# Patient Record
Sex: Female | Born: 1960 | Race: White | Hispanic: No | Smoking: Current every day smoker
Health system: Southern US, Community
[De-identification: ages and names within clinical notes are randomized; demographics above are authoritative.]

## PROBLEM LIST (undated history)

## (undated) DIAGNOSIS — E785 Hyperlipidemia, unspecified: Secondary | ICD-10-CM

## (undated) DIAGNOSIS — I2510 Atherosclerotic heart disease of native coronary artery without angina pectoris: Secondary | ICD-10-CM

## (undated) DIAGNOSIS — Z9861 Coronary angioplasty status: Secondary | ICD-10-CM

## (undated) DIAGNOSIS — F419 Anxiety disorder, unspecified: Secondary | ICD-10-CM

## (undated) DIAGNOSIS — I10 Essential (primary) hypertension: Secondary | ICD-10-CM

## (undated) DIAGNOSIS — C50919 Malignant neoplasm of unspecified site of unspecified female breast: Secondary | ICD-10-CM

## (undated) DIAGNOSIS — F17200 Nicotine dependence, unspecified, uncomplicated: Secondary | ICD-10-CM

## (undated) HISTORY — DX: Atherosclerotic heart disease of native coronary artery without angina pectoris: I25.10

## (undated) HISTORY — DX: Essential (primary) hypertension: I10

## (undated) HISTORY — DX: Hyperlipidemia, unspecified: E78.5

## (undated) HISTORY — DX: Coronary angioplasty status: Z98.61

## (undated) HISTORY — DX: Anxiety disorder, unspecified: F41.9

## (undated) HISTORY — PX: CORONARY ANGIOPLASTY WITH STENT PLACEMENT: SHX49

## (undated) HISTORY — DX: Malignant neoplasm of unspecified site of unspecified female breast: C50.919

## (undated) HISTORY — DX: Nicotine dependence, unspecified, uncomplicated: F17.200

---

## 1999-02-03 ENCOUNTER — Other Ambulatory Visit: Admission: RE | Admit: 1999-02-03 | Discharge: 1999-02-03 | Payer: Self-pay | Admitting: *Deleted

## 2001-10-16 ENCOUNTER — Other Ambulatory Visit: Admission: RE | Admit: 2001-10-16 | Discharge: 2001-10-16 | Payer: Self-pay | Admitting: *Deleted

## 2002-12-20 DIAGNOSIS — C50919 Malignant neoplasm of unspecified site of unspecified female breast: Secondary | ICD-10-CM

## 2002-12-20 HISTORY — DX: Malignant neoplasm of unspecified site of unspecified female breast: C50.919

## 2002-12-20 HISTORY — PX: BREAST LUMPECTOMY: SHX2

## 2002-12-27 ENCOUNTER — Encounter (INDEPENDENT_AMBULATORY_CARE_PROVIDER_SITE_OTHER): Payer: Self-pay | Admitting: Specialist

## 2002-12-27 ENCOUNTER — Encounter: Payer: Self-pay | Admitting: Family Medicine

## 2002-12-27 ENCOUNTER — Encounter: Admission: RE | Admit: 2002-12-27 | Discharge: 2002-12-27 | Payer: Self-pay | Admitting: Nurse Practitioner

## 2002-12-27 ENCOUNTER — Other Ambulatory Visit: Admission: RE | Admit: 2002-12-27 | Discharge: 2002-12-27 | Payer: Self-pay | Admitting: Diagnostic Radiology

## 2002-12-31 ENCOUNTER — Encounter: Admission: RE | Admit: 2002-12-31 | Discharge: 2002-12-31 | Payer: Self-pay | Admitting: General Surgery

## 2002-12-31 ENCOUNTER — Encounter: Payer: Self-pay | Admitting: General Surgery

## 2003-01-01 ENCOUNTER — Encounter: Payer: Self-pay | Admitting: General Surgery

## 2003-01-01 ENCOUNTER — Ambulatory Visit (HOSPITAL_BASED_OUTPATIENT_CLINIC_OR_DEPARTMENT_OTHER): Admission: RE | Admit: 2003-01-01 | Discharge: 2003-01-01 | Payer: Self-pay | Admitting: General Surgery

## 2003-01-01 ENCOUNTER — Encounter (INDEPENDENT_AMBULATORY_CARE_PROVIDER_SITE_OTHER): Payer: Self-pay | Admitting: *Deleted

## 2003-01-10 ENCOUNTER — Ambulatory Visit: Admission: RE | Admit: 2003-01-10 | Discharge: 2003-02-05 | Payer: Self-pay | Admitting: Radiation Oncology

## 2003-01-14 ENCOUNTER — Encounter: Payer: Self-pay | Admitting: Oncology

## 2003-01-14 ENCOUNTER — Ambulatory Visit (HOSPITAL_COMMUNITY): Admission: RE | Admit: 2003-01-14 | Discharge: 2003-01-14 | Payer: Self-pay | Admitting: Oncology

## 2003-01-17 ENCOUNTER — Ambulatory Visit: Admission: RE | Admit: 2003-01-17 | Discharge: 2003-01-17 | Payer: Self-pay | Admitting: Oncology

## 2003-01-17 ENCOUNTER — Encounter: Payer: Self-pay | Admitting: Cardiology

## 2003-01-28 ENCOUNTER — Encounter: Payer: Self-pay | Admitting: General Surgery

## 2003-01-28 ENCOUNTER — Ambulatory Visit (HOSPITAL_BASED_OUTPATIENT_CLINIC_OR_DEPARTMENT_OTHER): Admission: RE | Admit: 2003-01-28 | Discharge: 2003-01-28 | Payer: Self-pay | Admitting: General Surgery

## 2003-04-17 ENCOUNTER — Other Ambulatory Visit: Admission: RE | Admit: 2003-04-17 | Discharge: 2003-04-17 | Payer: Self-pay | Admitting: Oncology

## 2003-07-25 ENCOUNTER — Ambulatory Visit: Admission: RE | Admit: 2003-07-25 | Discharge: 2003-10-04 | Payer: Self-pay | Admitting: Radiation Oncology

## 2003-08-01 ENCOUNTER — Encounter: Admission: RE | Admit: 2003-08-01 | Discharge: 2003-08-01 | Payer: Self-pay | Admitting: Radiation Oncology

## 2003-08-27 ENCOUNTER — Encounter: Admission: EM | Admit: 2003-08-27 | Discharge: 2003-08-27 | Payer: Self-pay | Admitting: Dentistry

## 2003-09-04 ENCOUNTER — Ambulatory Visit (HOSPITAL_COMMUNITY): Admission: RE | Admit: 2003-09-04 | Discharge: 2003-09-04 | Payer: Self-pay | Admitting: Dentistry

## 2003-09-16 ENCOUNTER — Ambulatory Visit (HOSPITAL_BASED_OUTPATIENT_CLINIC_OR_DEPARTMENT_OTHER): Admission: RE | Admit: 2003-09-16 | Discharge: 2003-09-16 | Payer: Self-pay | Admitting: General Surgery

## 2003-11-07 ENCOUNTER — Ambulatory Visit: Admission: RE | Admit: 2003-11-07 | Discharge: 2003-11-07 | Payer: Self-pay | Admitting: Radiation Oncology

## 2003-12-16 ENCOUNTER — Encounter: Admission: RE | Admit: 2003-12-16 | Discharge: 2003-12-16 | Payer: Self-pay | Admitting: Family Medicine

## 2004-01-16 ENCOUNTER — Encounter: Admission: RE | Admit: 2004-01-16 | Discharge: 2004-01-16 | Payer: Self-pay | Admitting: General Surgery

## 2004-01-17 ENCOUNTER — Encounter (INDEPENDENT_AMBULATORY_CARE_PROVIDER_SITE_OTHER): Payer: Self-pay | Admitting: *Deleted

## 2004-01-17 ENCOUNTER — Encounter: Admission: RE | Admit: 2004-01-17 | Discharge: 2004-01-17 | Payer: Self-pay | Admitting: General Surgery

## 2004-05-07 ENCOUNTER — Ambulatory Visit: Admission: RE | Admit: 2004-05-07 | Discharge: 2004-05-07 | Payer: Self-pay | Admitting: Radiation Oncology

## 2004-07-31 ENCOUNTER — Encounter: Admission: RE | Admit: 2004-07-31 | Discharge: 2004-07-31 | Payer: Self-pay | Admitting: Oncology

## 2004-08-30 ENCOUNTER — Emergency Department (HOSPITAL_COMMUNITY): Admission: EM | Admit: 2004-08-30 | Discharge: 2004-08-31 | Payer: Self-pay | Admitting: Emergency Medicine

## 2004-09-02 ENCOUNTER — Encounter (INDEPENDENT_AMBULATORY_CARE_PROVIDER_SITE_OTHER): Payer: Self-pay | Admitting: Cardiology

## 2004-09-02 ENCOUNTER — Inpatient Hospital Stay (HOSPITAL_COMMUNITY): Admission: EM | Admit: 2004-09-02 | Discharge: 2004-09-07 | Payer: Self-pay | Admitting: Emergency Medicine

## 2004-09-14 ENCOUNTER — Ambulatory Visit: Payer: Self-pay | Admitting: Dentistry

## 2004-11-06 ENCOUNTER — Ambulatory Visit (HOSPITAL_COMMUNITY): Admission: RE | Admit: 2004-11-06 | Discharge: 2004-11-06 | Payer: Self-pay | Admitting: Cardiology

## 2004-11-22 ENCOUNTER — Ambulatory Visit: Payer: Self-pay | Admitting: Oncology

## 2004-12-04 ENCOUNTER — Ambulatory Visit: Payer: Self-pay | Admitting: Dentistry

## 2005-01-15 ENCOUNTER — Ambulatory Visit: Payer: Self-pay | Admitting: Oncology

## 2005-04-29 ENCOUNTER — Ambulatory Visit: Payer: Self-pay | Admitting: Oncology

## 2005-07-19 ENCOUNTER — Ambulatory Visit: Payer: Self-pay | Admitting: Oncology

## 2005-07-26 ENCOUNTER — Ambulatory Visit: Payer: Self-pay | Admitting: Dentistry

## 2005-08-02 ENCOUNTER — Encounter: Admission: RE | Admit: 2005-08-02 | Discharge: 2005-08-02 | Payer: Self-pay | Admitting: Oncology

## 2005-08-29 ENCOUNTER — Emergency Department (HOSPITAL_COMMUNITY): Admission: EM | Admit: 2005-08-29 | Discharge: 2005-08-29 | Payer: Self-pay | Admitting: Emergency Medicine

## 2006-01-24 ENCOUNTER — Encounter: Admission: RE | Admit: 2006-01-24 | Discharge: 2006-01-24 | Payer: Self-pay | Admitting: Orthopedic Surgery

## 2006-01-24 ENCOUNTER — Ambulatory Visit: Payer: Self-pay | Admitting: Oncology

## 2006-02-04 ENCOUNTER — Ambulatory Visit: Payer: Self-pay | Admitting: Dentistry

## 2006-03-14 ENCOUNTER — Ambulatory Visit: Payer: Self-pay | Admitting: Dentistry

## 2006-03-25 ENCOUNTER — Encounter: Admission: RE | Admit: 2006-03-25 | Discharge: 2006-03-25 | Payer: Self-pay | Admitting: Gastroenterology

## 2006-07-11 ENCOUNTER — Encounter: Admission: RE | Admit: 2006-07-11 | Discharge: 2006-07-11 | Payer: Self-pay | Admitting: Orthopedic Surgery

## 2006-09-05 ENCOUNTER — Encounter: Admission: RE | Admit: 2006-09-05 | Discharge: 2006-09-05 | Payer: Self-pay | Admitting: Oncology

## 2006-09-30 ENCOUNTER — Ambulatory Visit: Payer: Self-pay | Admitting: Oncology

## 2006-10-07 ENCOUNTER — Ambulatory Visit: Payer: Self-pay | Admitting: Dentistry

## 2006-11-07 ENCOUNTER — Encounter: Admission: RE | Admit: 2006-11-07 | Discharge: 2006-11-07 | Payer: Self-pay | Admitting: Orthopedic Surgery

## 2006-11-18 ENCOUNTER — Ambulatory Visit: Payer: Self-pay | Admitting: Oncology

## 2006-11-22 LAB — COMPREHENSIVE METABOLIC PANEL
Albumin: 4.9 g/dL (ref 3.5–5.2)
BUN: 11 mg/dL (ref 6–23)
CO2: 25 mEq/L (ref 19–32)
Glucose, Bld: 101 mg/dL — ABNORMAL HIGH (ref 70–99)
Sodium: 142 mEq/L (ref 135–145)
Total Bilirubin: 0.6 mg/dL (ref 0.3–1.2)
Total Protein: 7.2 g/dL (ref 6.0–8.3)

## 2006-11-22 LAB — CBC WITH DIFFERENTIAL/PLATELET
Basophils Absolute: 0 10*3/uL (ref 0.0–0.1)
Eosinophils Absolute: 0.1 10*3/uL (ref 0.0–0.5)
HCT: 42 % (ref 34.8–46.6)
HGB: 14.6 g/dL (ref 11.6–15.9)
LYMPH%: 26.5 % (ref 14.0–48.0)
MCV: 97.5 fL (ref 81.0–101.0)
MONO#: 0.4 10*3/uL (ref 0.1–0.9)
NEUT#: 4.6 10*3/uL (ref 1.5–6.5)
Platelets: 230 10*3/uL (ref 145–400)
RBC: 4.31 10*6/uL (ref 3.70–5.32)
WBC: 7 10*3/uL (ref 3.9–10.0)

## 2006-11-22 LAB — CANCER ANTIGEN 27.29: CA 27.29: 8 U/mL (ref 0–39)

## 2006-12-22 ENCOUNTER — Encounter: Admission: RE | Admit: 2006-12-22 | Discharge: 2006-12-22 | Payer: Self-pay | Admitting: Orthopedic Surgery

## 2007-05-19 ENCOUNTER — Ambulatory Visit: Payer: Self-pay | Admitting: Dentistry

## 2007-06-05 ENCOUNTER — Ambulatory Visit: Payer: Self-pay | Admitting: Oncology

## 2007-08-18 ENCOUNTER — Ambulatory Visit: Payer: Self-pay | Admitting: Oncology

## 2007-08-23 ENCOUNTER — Inpatient Hospital Stay (HOSPITAL_COMMUNITY): Admission: EM | Admit: 2007-08-23 | Discharge: 2007-08-29 | Payer: Self-pay | Admitting: Emergency Medicine

## 2007-08-23 ENCOUNTER — Encounter: Payer: Self-pay | Admitting: Emergency Medicine

## 2007-08-23 LAB — BASIC METABOLIC PANEL
CO2: 23 mEq/L (ref 19–32)
Calcium: 9.6 mg/dL (ref 8.4–10.5)
Potassium: 3.7 mEq/L (ref 3.5–5.3)
Sodium: 139 mEq/L (ref 135–145)

## 2007-09-07 ENCOUNTER — Encounter: Admission: RE | Admit: 2007-09-07 | Discharge: 2007-09-07 | Payer: Self-pay | Admitting: Oncology

## 2007-09-29 ENCOUNTER — Ambulatory Visit: Payer: Self-pay | Admitting: Oncology

## 2008-03-04 ENCOUNTER — Ambulatory Visit: Payer: Self-pay | Admitting: Oncology

## 2008-03-06 LAB — CBC WITH DIFFERENTIAL/PLATELET
BASO%: 0.3 % (ref 0.0–2.0)
EOS%: 5.1 % (ref 0.0–7.0)
HGB: 13.9 g/dL (ref 11.6–15.9)
MCH: 33.4 pg (ref 26.0–34.0)
MCHC: 35 g/dL (ref 32.0–36.0)
RDW: 12.3 % (ref 11.3–14.5)
lymph#: 2 10*3/uL (ref 0.9–3.3)

## 2008-03-07 LAB — COMPREHENSIVE METABOLIC PANEL
ALT: 11 U/L (ref 0–35)
AST: 16 U/L (ref 0–37)
Albumin: 4.9 g/dL (ref 3.5–5.2)
Calcium: 10 mg/dL (ref 8.4–10.5)
Chloride: 105 mEq/L (ref 96–112)
Potassium: 4.3 mEq/L (ref 3.5–5.3)
Sodium: 139 mEq/L (ref 135–145)

## 2008-03-19 ENCOUNTER — Encounter: Admission: RE | Admit: 2008-03-19 | Discharge: 2008-03-19 | Payer: Self-pay | Admitting: Orthopedic Surgery

## 2008-04-08 ENCOUNTER — Encounter: Admission: RE | Admit: 2008-04-08 | Discharge: 2008-04-08 | Payer: Self-pay | Admitting: Orthopedic Surgery

## 2008-04-19 ENCOUNTER — Encounter: Admission: RE | Admit: 2008-04-19 | Discharge: 2008-04-19 | Payer: Self-pay | Admitting: Orthopedic Surgery

## 2008-09-04 ENCOUNTER — Encounter: Admission: RE | Admit: 2008-09-04 | Discharge: 2008-09-04 | Payer: Self-pay | Admitting: Orthopedic Surgery

## 2008-09-09 ENCOUNTER — Encounter: Admission: RE | Admit: 2008-09-09 | Discharge: 2008-09-09 | Payer: Self-pay | Admitting: Oncology

## 2008-09-12 ENCOUNTER — Ambulatory Visit: Payer: Self-pay | Admitting: Oncology

## 2008-09-23 LAB — CBC WITH DIFFERENTIAL/PLATELET
Basophils Absolute: 0 10*3/uL (ref 0.0–0.1)
Eosinophils Absolute: 0.1 10*3/uL (ref 0.0–0.5)
HGB: 14.2 g/dL (ref 11.6–15.9)
LYMPH%: 39.9 % (ref 14.0–48.0)
MONO#: 0.3 10*3/uL (ref 0.1–0.9)
NEUT#: 2.7 10*3/uL (ref 1.5–6.5)
Platelets: 185 10*3/uL (ref 145–400)
RBC: 4.22 10*6/uL (ref 3.70–5.32)
WBC: 5.3 10*3/uL (ref 3.9–10.0)

## 2008-09-23 LAB — COMPREHENSIVE METABOLIC PANEL
ALT: 17 U/L (ref 0–35)
CO2: 26 mEq/L (ref 19–32)
Calcium: 9.4 mg/dL (ref 8.4–10.5)
Chloride: 104 mEq/L (ref 96–112)
Creatinine, Ser: 0.91 mg/dL (ref 0.40–1.20)
Total Protein: 6.7 g/dL (ref 6.0–8.3)

## 2008-09-23 LAB — CANCER ANTIGEN 27.29: CA 27.29: 12 U/mL (ref 0–39)

## 2008-09-27 ENCOUNTER — Encounter: Admission: RE | Admit: 2008-09-27 | Discharge: 2008-09-27 | Payer: Self-pay | Admitting: Oncology

## 2008-10-07 ENCOUNTER — Encounter: Admission: RE | Admit: 2008-10-07 | Discharge: 2008-10-07 | Payer: Self-pay | Admitting: Orthopedic Surgery

## 2008-10-18 ENCOUNTER — Inpatient Hospital Stay (HOSPITAL_COMMUNITY): Admission: EM | Admit: 2008-10-18 | Discharge: 2008-10-21 | Payer: Self-pay | Admitting: Emergency Medicine

## 2008-11-01 ENCOUNTER — Encounter: Admission: RE | Admit: 2008-11-01 | Discharge: 2008-11-01 | Payer: Self-pay | Admitting: Orthopedic Surgery

## 2009-03-11 ENCOUNTER — Encounter: Admission: RE | Admit: 2009-03-11 | Discharge: 2009-03-11 | Payer: Self-pay | Admitting: Orthopedic Surgery

## 2009-03-19 ENCOUNTER — Ambulatory Visit: Payer: Self-pay | Admitting: Oncology

## 2009-03-24 LAB — COMPREHENSIVE METABOLIC PANEL
Albumin: 4.2 g/dL (ref 3.5–5.2)
BUN: 17 mg/dL (ref 6–23)
CO2: 25 mEq/L (ref 19–32)
Calcium: 9.3 mg/dL (ref 8.4–10.5)
Chloride: 106 mEq/L (ref 96–112)
Glucose, Bld: 104 mg/dL — ABNORMAL HIGH (ref 70–99)
Potassium: 3.7 mEq/L (ref 3.5–5.3)
Sodium: 138 mEq/L (ref 135–145)
Total Protein: 6.6 g/dL (ref 6.0–8.3)

## 2009-03-24 LAB — CBC WITH DIFFERENTIAL/PLATELET
Basophils Absolute: 0 10*3/uL (ref 0.0–0.1)
Eosinophils Absolute: 0.1 10*3/uL (ref 0.0–0.5)
HGB: 14.4 g/dL (ref 11.6–15.9)
MCV: 92.4 fL (ref 79.5–101.0)
MONO#: 0.5 10*3/uL (ref 0.1–0.9)
MONO%: 7 % (ref 0.0–14.0)
NEUT#: 3.8 10*3/uL (ref 1.5–6.5)
RBC: 4.37 10*6/uL (ref 3.70–5.45)
RDW: 12.7 % (ref 11.2–14.5)
WBC: 6.7 10*3/uL (ref 3.9–10.3)
lymph#: 2.3 10*3/uL (ref 0.9–3.3)

## 2009-04-01 ENCOUNTER — Encounter: Admission: RE | Admit: 2009-04-01 | Discharge: 2009-04-01 | Payer: Self-pay | Admitting: Orthopedic Surgery

## 2009-05-06 ENCOUNTER — Encounter: Admission: RE | Admit: 2009-05-06 | Discharge: 2009-05-06 | Payer: Self-pay | Admitting: Orthopedic Surgery

## 2009-05-27 ENCOUNTER — Inpatient Hospital Stay (HOSPITAL_COMMUNITY): Admission: EM | Admit: 2009-05-27 | Discharge: 2009-05-29 | Payer: Self-pay | Admitting: Emergency Medicine

## 2009-08-23 ENCOUNTER — Emergency Department (HOSPITAL_COMMUNITY): Admission: EM | Admit: 2009-08-23 | Discharge: 2009-08-23 | Payer: Self-pay | Admitting: Emergency Medicine

## 2009-09-10 ENCOUNTER — Encounter: Admission: RE | Admit: 2009-09-10 | Discharge: 2009-09-10 | Payer: Self-pay | Admitting: Oncology

## 2009-10-16 ENCOUNTER — Ambulatory Visit: Payer: Self-pay | Admitting: Oncology

## 2009-11-10 LAB — CBC WITH DIFFERENTIAL/PLATELET
BASO%: 0.6 % (ref 0.0–2.0)
Eosinophils Absolute: 0.2 10*3/uL (ref 0.0–0.5)
HCT: 40.8 % (ref 34.8–46.6)
LYMPH%: 37.1 % (ref 14.0–49.7)
MCHC: 34.3 g/dL (ref 31.5–36.0)
MCV: 96.4 fL (ref 79.5–101.0)
MONO#: 0.3 10*3/uL (ref 0.1–0.9)
MONO%: 5.8 % (ref 0.0–14.0)
NEUT%: 52.6 % (ref 38.4–76.8)
Platelets: 185 10*3/uL (ref 145–400)
RBC: 4.24 10*6/uL (ref 3.70–5.45)

## 2009-11-10 LAB — COMPREHENSIVE METABOLIC PANEL
Alkaline Phosphatase: 57 U/L (ref 39–117)
CO2: 27 mEq/L (ref 19–32)
Creatinine, Ser: 0.93 mg/dL (ref 0.40–1.20)
Glucose, Bld: 123 mg/dL — ABNORMAL HIGH (ref 70–99)
Sodium: 137 mEq/L (ref 135–145)
Total Bilirubin: 0.8 mg/dL (ref 0.3–1.2)
Total Protein: 6.6 g/dL (ref 6.0–8.3)

## 2009-12-05 ENCOUNTER — Encounter: Admission: RE | Admit: 2009-12-05 | Discharge: 2009-12-05 | Payer: Self-pay | Admitting: Orthopedic Surgery

## 2010-02-24 ENCOUNTER — Encounter: Admission: RE | Admit: 2010-02-24 | Discharge: 2010-02-24 | Payer: Self-pay | Admitting: Orthopedic Surgery

## 2010-03-03 ENCOUNTER — Inpatient Hospital Stay (HOSPITAL_COMMUNITY): Admission: EM | Admit: 2010-03-03 | Discharge: 2010-03-06 | Payer: Self-pay | Admitting: Cardiology

## 2010-03-24 ENCOUNTER — Encounter (INDEPENDENT_AMBULATORY_CARE_PROVIDER_SITE_OTHER): Payer: Self-pay | Admitting: Cardiology

## 2010-10-09 ENCOUNTER — Encounter: Admission: RE | Admit: 2010-10-09 | Discharge: 2010-10-09 | Payer: Self-pay | Admitting: Family Medicine

## 2011-01-10 ENCOUNTER — Encounter: Payer: Self-pay | Admitting: Family Medicine

## 2011-01-10 ENCOUNTER — Encounter: Payer: Self-pay | Admitting: Oncology

## 2011-01-10 ENCOUNTER — Encounter: Payer: Self-pay | Admitting: Orthopedic Surgery

## 2011-01-27 ENCOUNTER — Other Ambulatory Visit: Payer: Self-pay | Admitting: Orthopedic Surgery

## 2011-01-27 DIAGNOSIS — M5410 Radiculopathy, site unspecified: Secondary | ICD-10-CM

## 2011-01-27 DIAGNOSIS — M47816 Spondylosis without myelopathy or radiculopathy, lumbar region: Secondary | ICD-10-CM

## 2011-01-29 IMAGING — CR DG RIBS W/ CHEST 3+V*L*
3 series · 3 of 3 positions shown · non-contrast
Comparison: Chest x-ray 10/18/2008

CLINICAL DATA: Fell.

LEFT RIBS AND CHEST - 3+ VIEW

[view not recorded (1 of 3)]
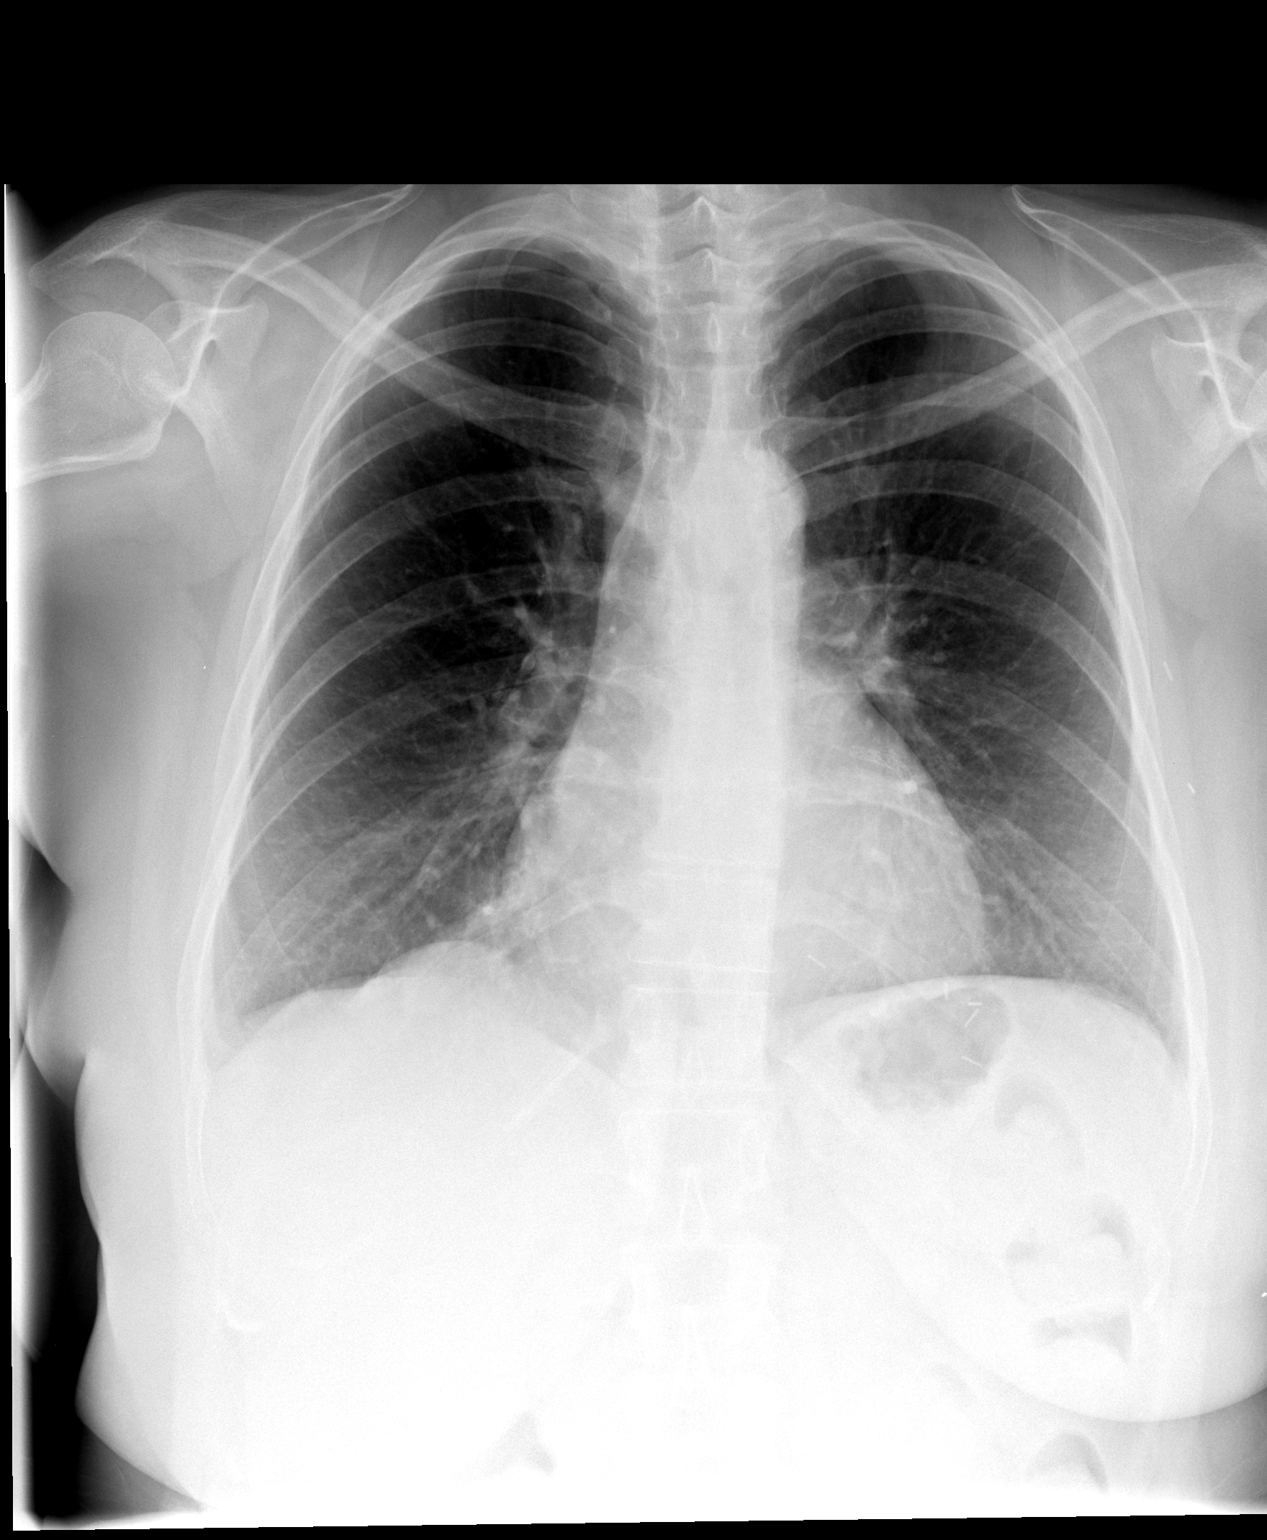

[view not recorded (2 of 3)]
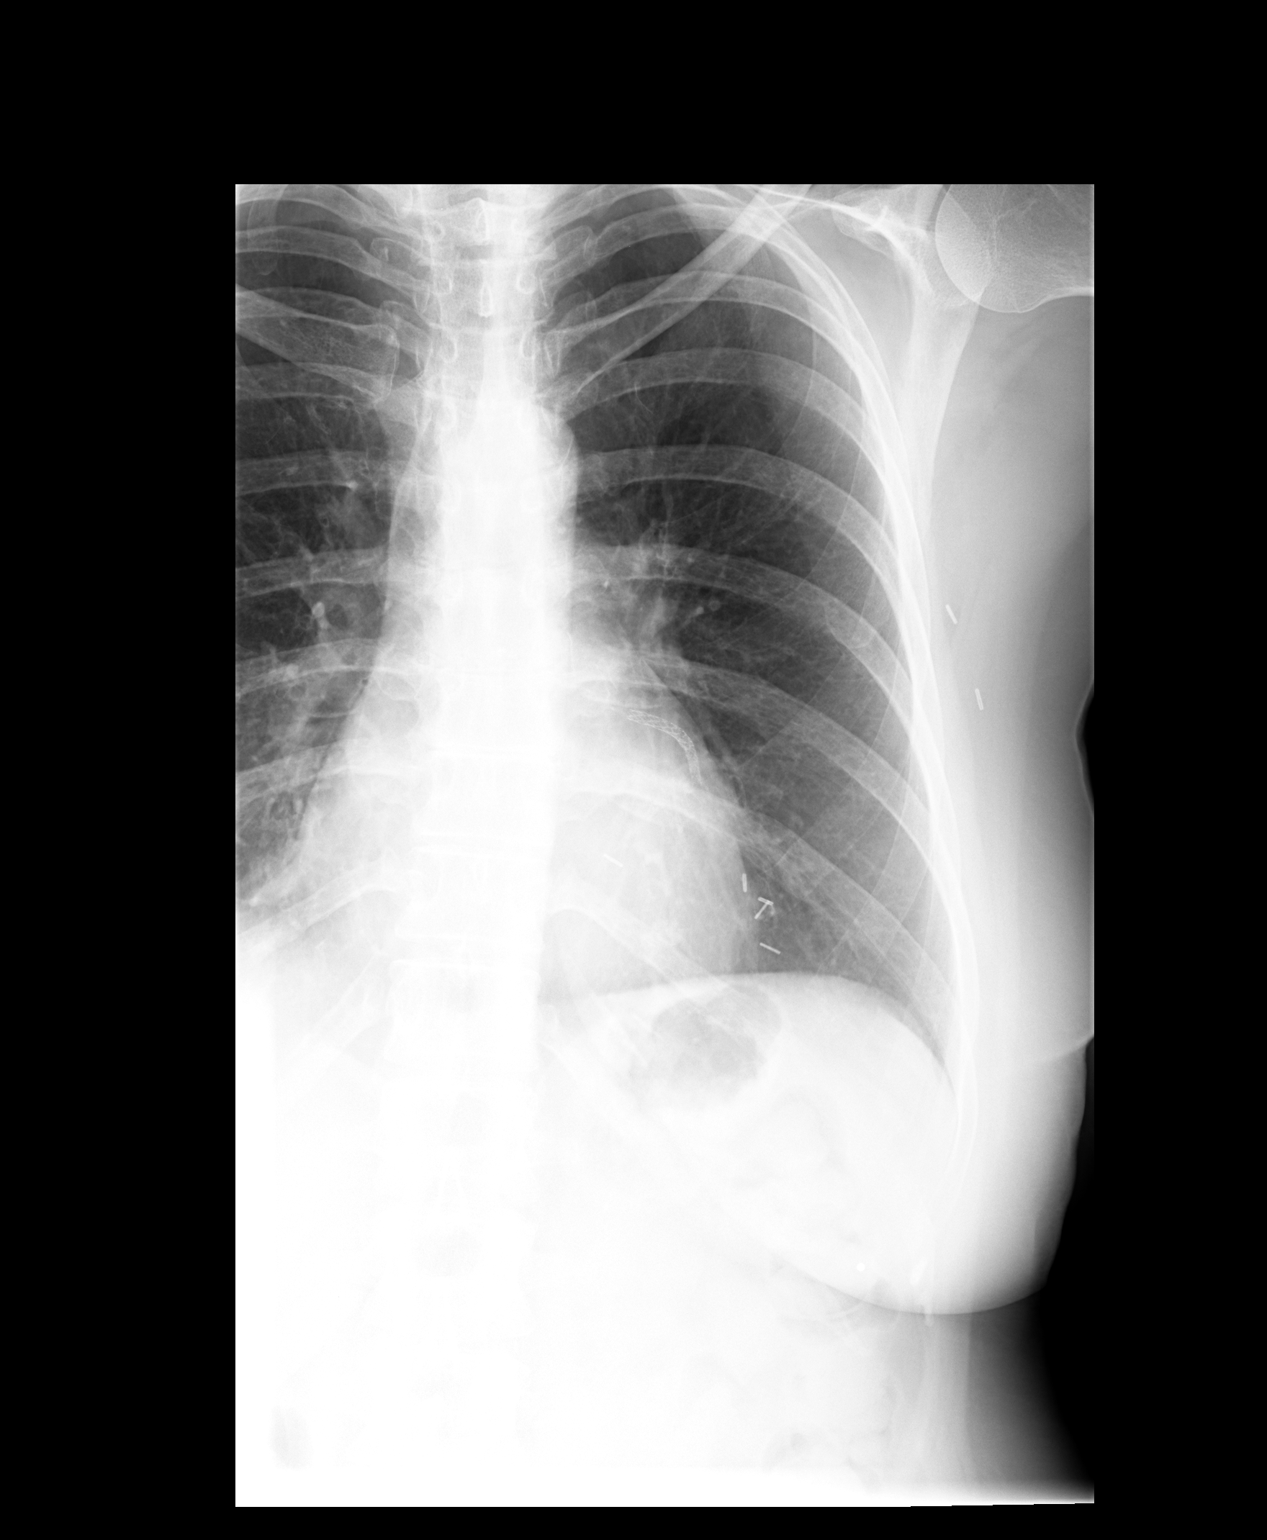

[view not recorded (3 of 3)]
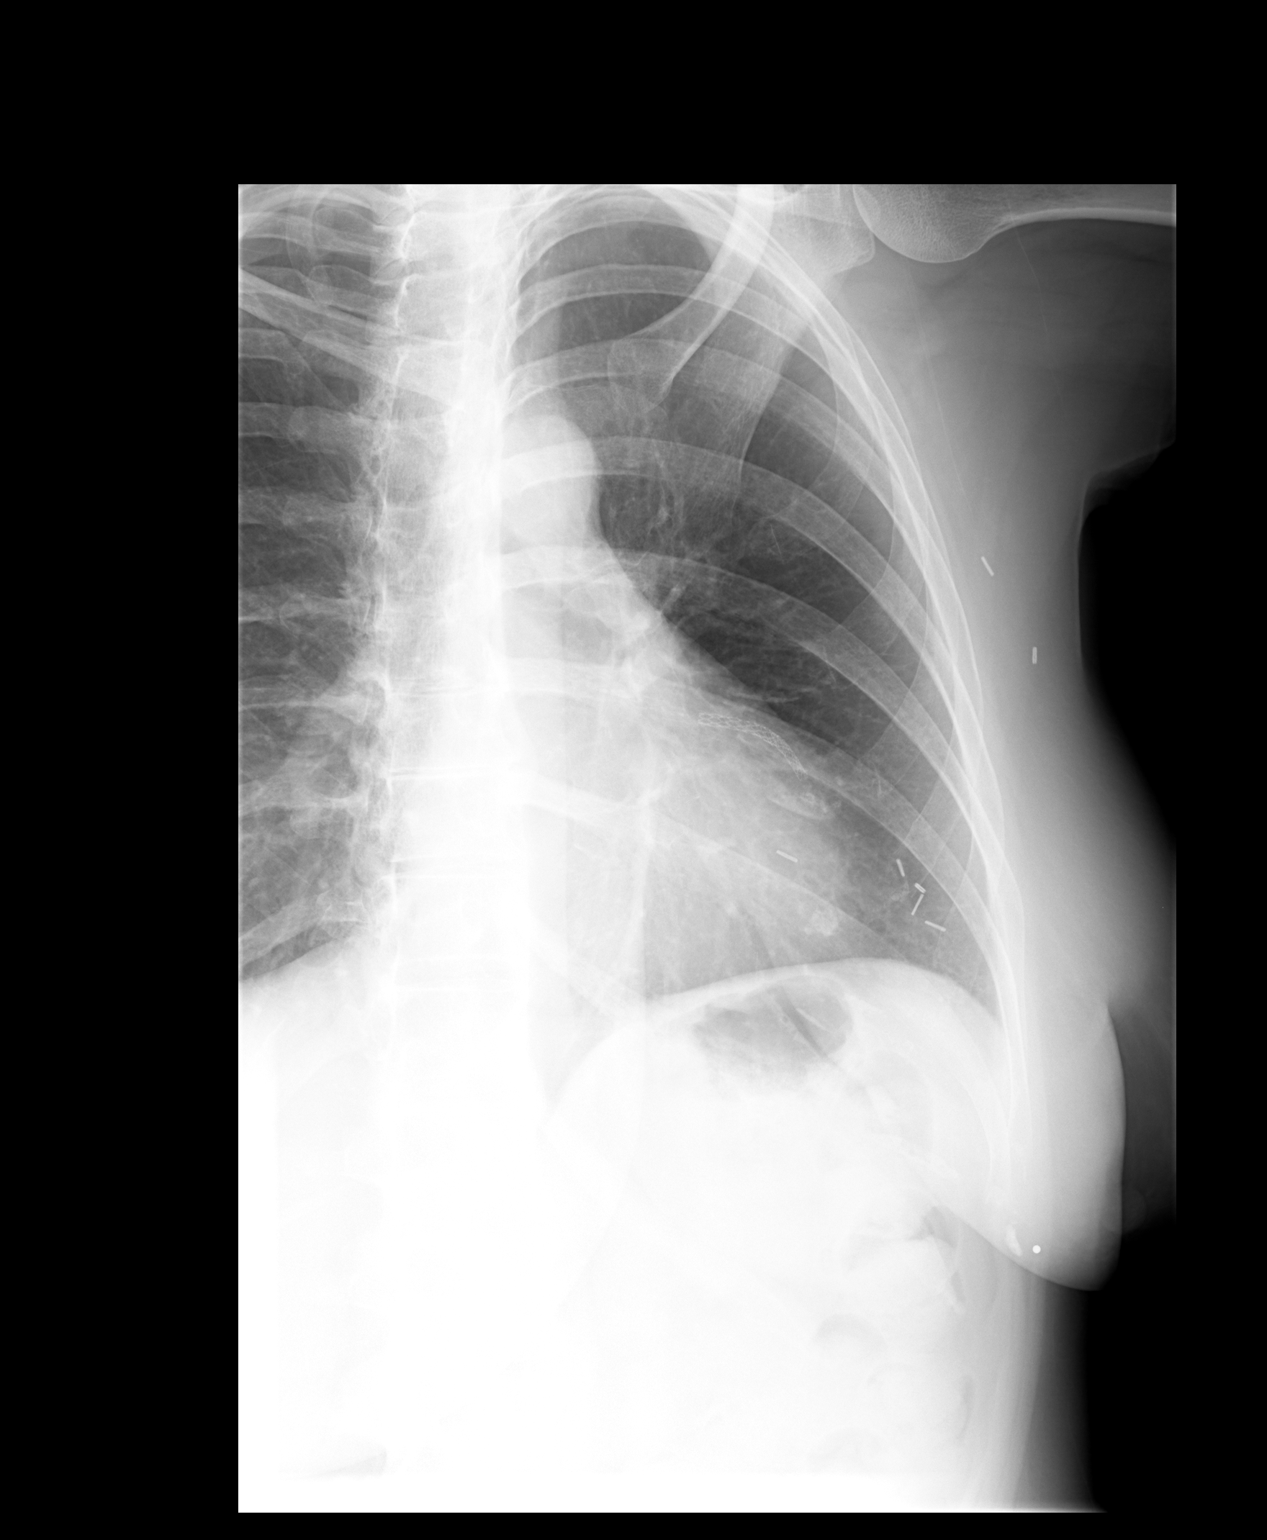

[3 of 3 positions shown; findings below may reference images not displayed]

FINDINGS: The cardiac silhouette, mediastinal and hilar contours
are within normal limits.  No acute pulmonary findings.

Dedicated views of the left ribs demonstrate no definite acute rib
fractures.  No pneumothorax, pleural effusion or pleural
thickening.  Coronary artery stent is noted.
IMPRESSION: 1.  No acute cardiopulmonary findings.
2.  No definite left-sided rib fractures.

## 2011-02-01 ENCOUNTER — Ambulatory Visit
Admission: RE | Admit: 2011-02-01 | Discharge: 2011-02-01 | Disposition: A | Discharge status: Home / Self Care | Payer: Medicaid Other | Source: Ambulatory Visit | Attending: Orthopedic Surgery | Admitting: Orthopedic Surgery

## 2011-02-01 DIAGNOSIS — M5410 Radiculopathy, site unspecified: Secondary | ICD-10-CM

## 2011-02-01 DIAGNOSIS — M47816 Spondylosis without myelopathy or radiculopathy, lumbar region: Secondary | ICD-10-CM

## 2011-03-12 ENCOUNTER — Other Ambulatory Visit: Payer: Self-pay | Admitting: Orthopedic Surgery

## 2011-03-12 DIAGNOSIS — M47816 Spondylosis without myelopathy or radiculopathy, lumbar region: Secondary | ICD-10-CM

## 2011-03-12 DIAGNOSIS — M5410 Radiculopathy, site unspecified: Secondary | ICD-10-CM

## 2011-03-14 LAB — T4, FREE: Free T4: 1.15 ng/dL (ref 0.80–1.80)

## 2011-03-14 LAB — COMPREHENSIVE METABOLIC PANEL
AST: 69 U/L — ABNORMAL HIGH (ref 0–37)
Albumin: 3.9 g/dL (ref 3.5–5.2)
Albumin: 3.9 g/dL (ref 3.5–5.2)
Alkaline Phosphatase: 55 U/L (ref 39–117)
BUN: 17 mg/dL (ref 6–23)
BUN: 18 mg/dL (ref 6–23)
CO2: 26 mEq/L (ref 19–32)
Chloride: 102 mEq/L (ref 96–112)
Chloride: 104 mEq/L (ref 96–112)
Creatinine, Ser: 0.74 mg/dL (ref 0.4–1.2)
Creatinine, Ser: 0.88 mg/dL (ref 0.4–1.2)
GFR calc Af Amer: >60 mL/min (ref >60)
GFR calc non Af Amer: >60 mL/min (ref >60)
Potassium: 3.9 mEq/L (ref 3.5–5.1)
Potassium: 3.9 mEq/L (ref 3.5–5.1)
Total Bilirubin: 0.6 mg/dL (ref 0.3–1.2)
Total Protein: 6.1 g/dL (ref 6.0–8.3)

## 2011-03-14 LAB — CBC
HCT: 36.8 % (ref 36.0–46.0)
HCT: 39.6 % (ref 36.0–46.0)
HCT: 41 % (ref 36.0–46.0)
Hemoglobin: 12.9 g/dL (ref 12.0–15.0)
MCV: 98.6 fL (ref 78.0–100.0)
MCV: 98.8 fL (ref 78.0–100.0)
MCV: 99.1 fL (ref 78.0–100.0)
Platelets: 220 10*3/uL (ref 150–400)
RBC: 3.59 MIL/uL — ABNORMAL LOW (ref 3.87–5.11)
RBC: 3.79 MIL/uL — ABNORMAL LOW (ref 3.87–5.11)
RBC: 4.14 MIL/uL (ref 3.87–5.11)
RDW: 13.6 % (ref 11.5–15.5)
RDW: 13.8 % (ref 11.5–15.5)
WBC: 14.8 10*3/uL — ABNORMAL HIGH (ref 4.0–10.5)
WBC: 8 10*3/uL (ref 4.0–10.5)
WBC: 9.1 10*3/uL (ref 4.0–10.5)

## 2011-03-14 LAB — BASIC METABOLIC PANEL
BUN: 16 mg/dL (ref 6–23)
Calcium: 8.1 mg/dL — ABNORMAL LOW (ref 8.4–10.5)
Calcium: 8.4 mg/dL (ref 8.4–10.5)
Calcium: 8.5 mg/dL (ref 8.4–10.5)
Chloride: 105 mEq/L (ref 96–112)
Creatinine, Ser: 0.78 mg/dL (ref 0.4–1.2)
Creatinine, Ser: 0.81 mg/dL (ref 0.4–1.2)
Creatinine, Ser: 0.88 mg/dL (ref 0.4–1.2)
GFR calc Af Amer: >60 mL/min (ref >60)
GFR calc Af Amer: >60 mL/min (ref >60)
GFR calc non Af Amer: >60 mL/min (ref >60)
Glucose, Bld: 142 mg/dL — ABNORMAL HIGH (ref 70–99)
Potassium: 3.7 mEq/L (ref 3.5–5.1)
Sodium: 134 mEq/L — ABNORMAL LOW (ref 135–145)

## 2011-03-14 LAB — DIFFERENTIAL
Eosinophils Relative: 0 % (ref 0–5)
Lymphocytes Relative: 15 % (ref 12–46)
Monocytes Absolute: 0.9 10*3/uL (ref 0.1–1.0)
Monocytes Relative: 6 % (ref 3–12)
Neutro Abs: 11.6 10*3/uL — ABNORMAL HIGH (ref 1.7–7.7)

## 2011-03-14 LAB — MRSA PCR SCREENING: MRSA by PCR: NEGATIVE

## 2011-03-14 LAB — CARDIAC PANEL(CRET KIN+CKTOT+MB+TROPI)
CK, MB: 100.8 ng/mL (ref 0.3–4.0)
Relative Index: 12.3 — ABNORMAL HIGH (ref 0.0–2.5)
Relative Index: 14.1 — ABNORMAL HIGH (ref 0.0–2.5)
Total CK: 713 U/L — ABNORMAL HIGH (ref 7–177)
Troponin I: 17.82 ng/mL (ref 0.00–0.06)

## 2011-03-14 LAB — LIPID PANEL
Triglycerides: 109 mg/dL (ref <150)
VLDL: 22 mg/dL (ref 0–40)

## 2011-03-14 LAB — T3 UPTAKE: T3 Uptake Ratio: 39.7 % — ABNORMAL HIGH (ref 22.5–37.0)

## 2011-03-14 LAB — TSH
TSH: 1.055 u[IU]/mL (ref 0.350–4.500)
TSH: 4.811 u[IU]/mL — ABNORMAL HIGH (ref 0.350–4.500)

## 2011-03-14 LAB — MAGNESIUM: Magnesium: 2 mg/dL (ref 1.5–2.5)

## 2011-03-17 ENCOUNTER — Other Ambulatory Visit: Payer: Self-pay | Admitting: Orthopedic Surgery

## 2011-03-17 ENCOUNTER — Ambulatory Visit
Admission: RE | Admit: 2011-03-17 | Discharge: 2011-03-17 | Disposition: A | Discharge status: Home / Self Care | Payer: Medicaid Other | Source: Ambulatory Visit | Attending: Orthopedic Surgery | Admitting: Orthopedic Surgery

## 2011-03-17 DIAGNOSIS — M47816 Spondylosis without myelopathy or radiculopathy, lumbar region: Secondary | ICD-10-CM

## 2011-03-17 DIAGNOSIS — M5410 Radiculopathy, site unspecified: Secondary | ICD-10-CM

## 2011-03-29 LAB — LIPID PANEL
Cholesterol: 93 mg/dL (ref 0–200)
HDL: 30 mg/dL — ABNORMAL LOW (ref >39)
Triglycerides: 84 mg/dL (ref <150)

## 2011-03-29 LAB — COMPREHENSIVE METABOLIC PANEL
ALT: 25 U/L (ref 0–35)
Albumin: 3.1 g/dL — ABNORMAL LOW (ref 3.5–5.2)
Alkaline Phosphatase: 46 U/L (ref 39–117)
Alkaline Phosphatase: 74 U/L (ref 39–117)
BUN: 10 mg/dL (ref 6–23)
BUN: 14 mg/dL (ref 6–23)
CO2: 26 mEq/L (ref 19–32)
CO2: 26 mEq/L (ref 19–32)
Calcium: 8.6 mg/dL (ref 8.4–10.5)
Calcium: 9.2 mg/dL (ref 8.4–10.5)
Chloride: 101 mEq/L (ref 96–112)
Chloride: 109 mEq/L (ref 96–112)
Creatinine, Ser: 1.02 mg/dL (ref 0.4–1.2)
Creatinine, Ser: 1.03 mg/dL (ref 0.4–1.2)
GFR calc non Af Amer: 44 mL/min — ABNORMAL LOW (ref >60)
GFR calc non Af Amer: 58 mL/min — ABNORMAL LOW (ref >60)
Glucose, Bld: 104 mg/dL — ABNORMAL HIGH (ref 70–99)
Glucose, Bld: 152 mg/dL — ABNORMAL HIGH (ref 70–99)
Potassium: 4.3 mEq/L (ref 3.5–5.1)
Potassium: 4.6 mEq/L (ref 3.5–5.1)
Sodium: 138 mEq/L (ref 135–145)
Total Bilirubin: 0.5 mg/dL (ref 0.3–1.2)
Total Protein: 5.2 g/dL — ABNORMAL LOW (ref 6.0–8.3)

## 2011-03-29 LAB — CBC
HCT: 32.6 % — ABNORMAL LOW (ref 36.0–46.0)
HCT: 35.4 % — ABNORMAL LOW (ref 36.0–46.0)
HCT: 39.9 % (ref 36.0–46.0)
Hemoglobin: 11.3 g/dL — ABNORMAL LOW (ref 12.0–15.0)
Hemoglobin: 16.3 g/dL — ABNORMAL HIGH (ref 12.0–15.0)
MCHC: 33.9 g/dL (ref 30.0–36.0)
MCHC: 34.6 g/dL (ref 30.0–36.0)
MCV: 97.7 fL (ref 78.0–100.0)
MCV: 98.3 fL (ref 78.0–100.0)
Platelets: 158 10*3/uL (ref 150–400)
Platelets: 198 10*3/uL (ref 150–400)
RBC: 3.34 MIL/uL — ABNORMAL LOW (ref 3.87–5.11)
RBC: 4.9 MIL/uL (ref 3.87–5.11)
RDW: 15 % (ref 11.5–15.5)
WBC: 9.1 10*3/uL (ref 4.0–10.5)

## 2011-03-29 LAB — URINE MICROSCOPIC-ADD ON

## 2011-03-29 LAB — HEMOCCULT GUIAC POC 1CARD (OFFICE): Fecal Occult Bld: POSITIVE

## 2011-03-29 LAB — URINALYSIS, ROUTINE W REFLEX MICROSCOPIC
Nitrite: NEGATIVE
Specific Gravity, Urine: 1.026 (ref 1.005–1.030)
Urobilinogen, UA: 1 mg/dL (ref 0.0–1.0)

## 2011-03-29 LAB — DIFFERENTIAL
Basophils Relative: 2 % — ABNORMAL HIGH (ref 0–1)
Eosinophils Absolute: 0.1 10*3/uL (ref 0.0–0.7)
Neutrophils Relative %: 80 % — ABNORMAL HIGH (ref 43–77)

## 2011-03-29 LAB — POCT PREGNANCY, URINE: Preg Test, Ur: NEGATIVE

## 2011-03-29 LAB — LIPASE, BLOOD: Lipase: 21 U/L (ref 11–59)

## 2011-05-04 NOTE — Cardiovascular Report (Signed)
[name], [name] NO.:  [id number]   MEDICAL RECORD NO.:  [id number]          PATIENT TYPE:  INP   LOCATION:  3705                         FACILITY:  [facility]   PHYSICIAN:  Nicki Guadalajara, M.D.     DATE OF BIRTH:  03-18-1961   DATE OF PROCEDURE:  DATE OF DISCHARGE:  10/21/2008                            CARDIAC CATHETERIZATION   INDICATIONS:  [name] is a 50 year old female who has a  history of breast CA, and known coronary artery disease.  She suffered  an anterior wall myocardial infarction in September 2005, and underwent  percutaneous coronary prevention to her LAD and had insertion of a 25 x  23 mm Cypher stent.  Ejection fraction at that time was 23%.  She did  have 60-70% lesions in her RCA.  Apparently, she continued to smoke  cigarettes.  In September 2008, she again developed an acute anterior  wall myocardial infarction and urgent repeat catheterization was done by  Dr. Jenne Campus on August 23, 2007.  At that time, acute ejection fraction  was 15-20%, and she underwent successful stenting of her LAD just beyond  the previously placed stent with insertion of a non-DES Driver 2.5 x 18  mm stent to the mid LAD.  The patient unfortunately continues to smoke  cigarettes and has had some intermittent chest and back discomfort.  Apparently, she had a stress test done on Friday.  Later that day, she  again did experience episodes of chest discomfort.  She was alarmed, was  concerned of possibly representing her prior infarct symptomatology,  presented to [facility].  She was seen over the weekend  by Dr. Jacinto Halim.  She is scheduled for definitive cardiac catheterization  after ruling out for myocardial infarction.   PROCEDURE:  After premedication with Versed 2 mg plus fentanyl 50 mcg,  for conscious sedation, the patient was prepped and draped in the usual  fashion.  Her right femoral artery was punctured anteriorly, and a 5-  French  sheath was inserted.  Diagnostic catheterization was done  utilizing 5-French Judkins for left and right coronary catheters.  A 5-  French pigtail catheter was used for biplane cine left ventriculography  as well as distal aortography.  Hemostasis was obtained by direct manual  pressure.  The patient tolerated the procedure well.   HEMODYNAMIC DATA:  Central aortic pressure is 116/53, left ventricular  pressure 116/9.   ANGIOGRAPHIC DATA:  Left main coronary artery had smooth distal tapering  of 30-40% prior to trifurcating into an LAD and moderate-sized ramus  intermediate vessel and a small AV groove circumflex system.   The LAD gave rise to a proximal septal perforating artery.  There was  then narrowing of approximately 40 to less than 50% immediately proximal  to the initial Cypher stent.  There also was some focal aneurysmal  dilatation in the proximal segment of the stented segment prior to  diagonal vessel.  The first diagonal was diminutive.  The remainder of  the stents were widely patent and without obstruction.   The intermediate vessel was angiographically normal.   The AV  groove circumflex was angiographically normal.   The right coronary artery had 50-60% narrowing in the proximal to mid  segment actually improved from previously and supplied the PDA and  posterolateral vessel.   Biplane cine left ventriculography revealed suggestion of LVH.  Ejection  fraction was a least 50-55% on the RAO projection with potential subtle  mid inferior hypocontractility.  On the LAO projection, contractility  was normal and ejection fraction appeared at least 55%.  Next, distal  aortography did not show any evidence for renal artery stenosis.  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   IMPRESSION:  1. Low normal/normal left ventricular contractility with left      ventricular hypertrophy (significantly improved  ejection fraction      from 15-20% at the time of the acute catheterization in a setting      of an anterior wall myocardial infarction in September 2008).  2. Multivessel coronary obstructive disease with 30-40% smooth distal      taper of the left main; 40-50% narrowing of the left anterior      descending proximal to the initially placed Cypher stent with mild      focal aneurysmal dilatation, the most proximal portion of this      stented segment without restenosis of tandem Cypher and non-drug      eluting stents in the mid left anterior descending.  3. Normal intermediate vessel.  4. Normal small  atrioventricular groove circumflex.  5. 50-60% narrowing in the proximal to mid right coronary artery, not      significantly changed.  6. 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   RECOMMENDATIONS:  Medical therapy.  The patient must discontinue  cigarette smoking.           ______________________________  Nicki Guadalajara, M.D.     TK/MEDQ  D:  10/21/2008  T:  10/21/2008  Job:  [id number]   cc:   Artist Pais Mina Marble, M.D.  [facility]

## 2011-05-04 NOTE — Discharge Summary (Signed)
Tracey [name], Tracey [name]             ACCOUNT NO.:  [id number]   MEDICAL RECORD NO.:  [id number]          PATIENT TYPE:  INP   LOCATION:  1405                         FACILITY:  [facility]   PHYSICIAN:  Peggye Pitt, M.D. DATE OF BIRTH:  1961/04/26   DATE OF ADMISSION:  05/27/2009  DATE OF DISCHARGE:  05/29/2009                               DISCHARGE SUMMARY   DISCHARGE DIAGNOSES:  1. Infectious colitis.  2. Nausea and vomiting, resolved.  3. Hypercalcemia, resolved.  4. Breast cancer.  5. Hypertension.  6. Coronary artery disease.  7. Hyperlipidemia.   DISCHARGE MEDICATIONS:  1. Cipro 250 mg p.o. b.i.d. for 11 days.  2. Flagyl 500 mg p.o. t.i.d. for 11 days.  3. Coreg 3.125 mg twice daily.  4. Omeprazole 20 mg daily.  5. Spironolactone 25 mg daily.  6. Arimidex 1 mg daily.  7. Alprazolam 0.5 mg twice daily as needed.  8. Crestor 5 mg daily.  9. Celebrex 200 mg once a day as needed.  10.Allegra 30 mg once a day.  11.Aspirin 81 mg once a day.  12.Lexapro 20 mg once a day.  13.Plavix 75 mg once a day.  14.Zetia 10 mg daily.  15.Calcium plus vitamin D 500 mg one tablet daily.   DISPOSITION AND FOLLOW UP:  [name] is discharged home today in  stable condition.  She has been instructed to call her family  practitioner, Dr. Rudi Heap, for a followup appointment in 2 weeks.  She is to complete 11 more days of Cipro and Flagyl for her infectious  colitis.   IMAGES AND PROCEDURES DURING THIS HOSPITALIZATION:  Include a CT scan of  the abdomen and pelvis with contrast on May 27, 2009 that showed  findings compatible with colitis.  Differential includes infectious  versus inflammatory.   HISTORY AND PHYSICAL:  For full details, please see dictation by Dr.  Jomarie Longs on May 27, 2009.  In brief, Tracey [name] is a very pleasant 50-  year-old Caucasian lady who came into the emergency department with  complaints of abdominal pain for the past 2 days.  It was localized  primarily to the left lower quadrant.  Denied any fevers or chills.  She  did have some nausea.  However, no vomiting.  She did state that a day  prior she had eaten at [facility], and she had also been taking Bactrim  for a urinary tract infection for the past week.  She says she had a  colonoscopy 1 to 2 years ago that was reported to her as normal.   HOSPITAL COURSE BY ACTIVE PROBLEM:  1. Nausea and diarrhea likely secondary to infectious colitis given      findings on CAT scan.  She has had no bowel movements while in the      hospital.  Hence, we have not been able to do any stool studies or      rule her out for clostridium difficile colitis, especially given      the fact that she had been recently on antibiotics for a urinary      tract infection.  However,  since the diarrhea has resolved, we have      deemed her ready to go home today to complete a 14-day course of      Cipro and Flagyl, of which she has 11 more days pending.  She has      been tolerating a full diet.  2. Hypercalcemia.  Initially noted upon admission to the hospital with      a calcium of 13.6.  She was given 1 dose of bisphosphonate.      However, on subsequent measure, her calcium has been completely      normal, so I question if this initial value was a lab error or if      it indeed was true.  Nonetheless, upon time of hospital followup      appointment, I would recommend that her calcium levels be      rechecked.  3. For her breast cancer.  She has been continued on Arimidex while in      the hospital.  4. For hypertension, her blood pressures were a bit low while in the      hospital.  She has been kept off of her ramipril and her Coreg dose      has been decreased to 3.125 mg twice daily.  5. For hyperlipidemia, she has had an excellent fasting lipid panel as      follows.  Total cholesterol of 93, triglyceride 84, HDL 30 with an      LDL of 46.  She has been continued on the Statin and Zetia, but her       Lovaza has been discontinued.  6. The rest of her chronic medical issues have not been a problem this      hospitalization.  I am going to proceed with her discharge home      today.   VITAL SIGNS ON THE DAY OF DISCHARGE:  Blood pressure 110/51, heart rate  60, respirations 15, O2 saturation 96% on room air with a temperature of  98.1.   DISCHARGE LABS:  Sodium 139, potassium 3.9, chloride 109, bicarb 86, BUN  10, creatinine 1.02, glucose 104, and albumin 3.0.  Calcium of 8.6, WBC  6.2, hemoglobin 11.3, and a platelet count of 142,000.      Peggye Pitt, M.D.  Electronically Signed     EH/MEDQ  D:  05/29/2009  T:  05/29/2009  Job:  [id number]   cc:   Ernestina Penna, M.D.  Fax: [phone]

## 2011-05-04 NOTE — Cardiovascular Report (Signed)
NAMENAMINE, BEAHM NO.:  [id number]   MEDICAL RECORD NO.:  [id number]          PATIENT TYPE:  INP   LOCATION:  2807                         FACILITY:  [facility]   PHYSICIAN:  Darlin Priestly, MD  DATE OF BIRTH:  [birth year]   DATE OF PROCEDURE:  08/23/2007  DATE OF DISCHARGE:                            CARDIAC CATHETERIZATION   PROCEDURES:  1. Left heart catheterization.  2. Coronary angiography.  3. Left ventriculogram.  4. LAD - mid.      a.     Percutaneous transluminal angioplasty.      b.     Placement of an intracoronary stent.   SURGEON:  Darlin Priestly, MD   COMPLICATIONS:  None.   INDICATIONS:  Mr. Tercero is a 50 year old female patient of Dr. Cristy Hilts.  Ganji with a history of ongoing tobacco use, history of remote breast CA  status post chemotherapy and XRT, history of known CAD status post  stenting of her LAD in 2005 using a Cypher stent.  She presented to the  [facility] ER on the early afternoon of August 23, 2007,  with acute anterior wall MI.  She is now brought for repeat  catheterization and probable percutaneous intervention.   DESCRIPTION OF PROCEDURE:  After giving informed written consent, the  patient was brought to the cardiac cath lab.  Right groin was shaved,  prepped and draped in sterile fashion.  ECG monitoring established.  Using modified Seldinger technique, 6 French arterial sheath inserted in  the right femoral artery.  A 6-French diagnostic catheter was used to  performed diagnostic angiography.   Left main is a large vessel with no significant disease.   The LAD is a medium-size vessel which is noted have a stent in the early  midportion.  The LAD is totally occluded within the previously placed  stent.  There are two aneurysmal areas noted, one prior to the stent and  one arising within the Cypher stent.  There is a small diagonal branch  noted.   Left circumflex is a medium-size vessel coursing to  the AV groove, gives  rise to two obtuse marginal branches.  The AV circumflex has no  significant disease.   First and second OMs are medium-size vessels with no significant  disease.   The RCA is a medium-size vessel which is dominant and gives rise to both  PDA as well as posterolateral branch.  The RCA is noted to have diffuse  40% proximal mid stenosis.  The remainder of the RCA has no significant  disease.   The PDA is a medium-size vessel with no significant disease.   The PLA is a small to medium size vessel with 40% midvessel lesion.   Left ventriculogram reveals serially depressed EF of 15-20% with  anterior, apical and inferior apical akinesis.   HEMODYNAMIC RESULTS:  Systemic arterial pressure 97/67, LV systemic  pressure was 223, LVEDP of 30.   INTERVENTIONAL PROCEDURE:  LAD - mid:  Following diagnostic angiography,  a 6-French JL-4 guiding catheter was __________ and engaged in the left  coronary ostium.  Next a  0.014 Prowater guidewire was advanced down the  guiding catheter and used to cross the mid-LAD total occlusion and  positioned in the distal LAD without difficulty.  Following this, a 2.5  x 15 mm Fire Star balloon was then used to cross the total occlusion and  then pulled back within the guide to confirm the guidewire was in fact  intraluminal and distal LAD was noted to be __________ .  The Fire Star  was then positioned at the site of the total occlusion and two  inflations to 10 atmospheres were performed for a total of 20 seconds.  Follow-up angiogram revealed restoration of TIMI-3 flow into the distal  vessel.  Two additional inflations to 8 atmospheres were performed for a  total of 16 and 13 seconds.  It was noted just distal to the Cypher  stent, there was a linear dissection of the LAD which was the probable  cause of the total occlusion.  There were two aneurysmal segments noted  in the proximal LAD and within the previously stented segment.   Because  of this, I did ask Dr. Clarene Duke to review the films to see his opinion  about whether these need to be surgically ligated, could they possibly  be secondary to drug on the Cypher stent.  We did speak to Dr. Cornelius Moras, who  felt like these were not large enough to require surgical ligation and  to that end we proceeded with stenting of the dissected area using a non-  DES stent to try to prevent further aneurysmal segments.  We then took a  Micro-Driver 2.5 x 18 mm stent to cover the dissection and carefully  overlap this with the previously placed Cypher stent.  This stent was  inflated to 9 atmospheres of pressure for a total of 19 seconds.  Two  subsequent inflations to 14 atmospheres were performed throughout the  overlapping and proximal segment for approximately 32 seconds.  This  balloon was then removed and a DuraStar 2.5 x 10 mm balloon was then  positioned over the overlapping segment and 3 inflations to a maximum of  18 atmospheres were performed for a total of approximately 40 seconds.  Follow-up angiogram revealed good luminal gain with evidence of  dissection or thrombus and TIMI-3 flow to distal vessel.  IV Integrilin  used throughout the case.  Interventional doses of heparin given to  maintain ACT between 200-300.   Final orthogonal angiograms revealed less than 10% stenosis in the LAD  stenotic lesion with TIMI-3 flow to distal vessel.  At this point, we  elected to conclude the procedure.  All balloons, wires and caths  removed.  Hemostatic sheath was sewn in place and the patient was  transferred back to the ward in stable condition.   CONCLUSION:  1. Successful percutaneous transluminal coronary artery balloon      angioplasty and placement of a Driver 2.5 x 18 mm stent in the mid      left anterior descending distal to the previous Cypher stent with      distal overlapping segments.  2. Severely depressed left ventricular systolic function.  3. Elevated left  ventricular end diastolic pressure.  4. Adjunct use of Integrilin infusion.      Darlin Priestly, MD  Electronically Signed     RHM/MEDQ  D:  08/23/2007  T:  08/23/2007  Job:  [id number]   cc:   Cristy Hilts. Jacinto Halim, MD

## 2011-05-04 NOTE — Discharge Summary (Signed)
Tracey [name], Tracey [name]             ACCOUNT NO.:  [id number]   MEDICAL RECORD NO.:  [id number]          PATIENT TYPE:  INP   LOCATION:  3705                         FACILITY:  [facility]   PHYSICIAN:  Cristy Hilts. Jacinto Halim, MD       DATE OF BIRTH:  [birth year]   DATE OF ADMISSION:  10/18/2008  DATE OF DISCHARGE:  10/21/2008                               DISCHARGE SUMMARY   DISCHARGE DIAGNOSES:  1. Unstable angina, catheterization on this admission, plans for      continued medical therapy.  2. History of coronary disease with sudden cardiac death, treated with      left anterior descending intervention in 05-10-2004, patent stent on this      admission.  3. Cardiomyopathy at catheterization in 2007-05-11 with an ejection fraction      of 15%, now ejection fraction is 50-55% at catheterization.  4. Dyslipidemia with a history of statin intolerance, she is      tolerating low-dose Crestor currently.  5. History of smoking.  6. History of treated hypertension.  7. History of breast cancer, on Arimidex.   HOSPITAL COURSE:  The patient is a 50 year old female with history of  sudden cardiac death in 2004-05-10.  She had a stent to the LAD x2.  In 2007-05-11,  she had a nonQ MI.  EF at that time was 15%.  The patient had an  outpatient Myoview at the office and felt chest pain and was admitted  for further evaluation.  Symptoms were worrisome for unstable angina.  She was put on Lovenox and ruled out.  She was set up for diagnostic  catheterization on October 21, 2008.  This revealed 50-60% proximal RCA  and 30-40% left main tapering, normal circumflex, normal ramus, 40-50%  proximal LAD with patent LAD stents and an EF of 50-55% with normal  renal arteries and normal iliacs.  She had small distal aortic  dilatation above the iliacs.  Plan is for medical therapy.  She was seen  in consult by the smoking cessation counselor.  She says nicotine  patches helped in the past, and she will try this again and also asked  for a  prescription for Chantix, although I am not sure she would be able  to afford it.  We feel she can be discharged after catheterization on  October 21, 2008.   DISCHARGE MEDICATIONS:  1. Lovaza 1 gram daily.  2. Zetia 10 mg a day.  3. Ramipril 5 mg a day.  4. Coreg CR 10 mg a day.  5. Alprazolam 0.5 mg q.8 h. p.r.n.  6. Oxycodone p.r.n. 5/325.  7. Prilosec 20 mg a day.  8. Spironolactone 25 mg a day.  9. Crestor 5 mg 4 days a week.  10.Arimidex 1 mg a day.  11.Celebrex 200 mg a day.  12.Plavix 75 mg a day.  13.Aspirin 81 mg 2 tablets a day.  14.Nitroglycerin sublingual p.r.n.  15.NicoDerm patch 14 mg daily as directed.  16.Chantix pack as directed.   LABORATORY:  TSH is 3.39.  Troponins are negative.  LDL is 81 and HDL  42.  Liver functions are normal.  Sodium 139, potassium 3.9, BUN 18, and  creatinine 0.8.  White count 8.3, hemoglobin 13.5, hematocrit 39, and  platelets 210.  Chest x-ray shows no acute disease with scarring at the  right lung base.  EKG shows sinus rhythm without acute changes, sinus  bradycardia.   DISPOSITION:  The patient will be discharged in stable condition and  follow up with Dr. Jacinto Halim.      Abelino Derrick, P.A.      Cristy Hilts. Jacinto Halim, MD  Electronically Signed    LKK/MEDQ  D:  10/21/2008  T:  10/22/2008  Job:  [id number]   cc:   [facility]  Artist Pais. Mina Marble, M.D.

## 2011-05-04 NOTE — H&P (Signed)
NAMETIKA, HANNIS NO.:  [id number]   MEDICAL RECORD NO.:  [id number]          PATIENT TYPE:  EMS   LOCATION:  ED                           FACILITY:  [facility]   PHYSICIAN:  Zannie Cove, MD     DATE OF BIRTH:  [birth year]   DATE OF ADMISSION:  05/27/2009  DATE OF DISCHARGE:                              HISTORY & PHYSICAL   PRIMARY CARE PHYSICIAN:  Dr. Christell Constant at the [facility].   CHIEF COMPLAINT:  Abdominal pain and diarrhea.   HISTORY OF PRESENT ILLNESS:  Ms. Savary is a 50 year old female who  presents to the emergency department with chief complaint of abdominal  pain for the past 2 days.  She describes the pain as constant, located  primarily in the left lower quadrant, locationally generalized and  intermittently increasing in character.  She also reports nausea.  Denies any vomiting.  Denies any fevers or chills.  She says that prior  to the onset of all these symptoms, she had been constipated for the  past few days.  In addition to the abdominal pain, she also reports 2  episodes of diarrhea, watery with bright red blood mixed with stool last  night and early this morning.  She says the amount of blood was small in  quantity, intermittent streaking the stool.  No sick contacts.  She ate  at [facility] yesterday.  Has also been taking Bactrim for a UTI for the  past week.  She denies any prior or similar episodes of abdominal pain  or diarrhea.  Reports having a colonoscopy 1-2 years ago which was  allegedly normal.   PAST MEDICAL HISTORY:  1. Significant for coronary artery disease status post PCI.  2. Hypertension.  3. Dyslipidemia.  4. Chronic back pain.  5. Breast cancer.   PAST SURGICAL HISTORY:  1. Significant for lumpectomy.  2. Back surgery.  3. C. section.   MEDICATIONS:  1. Lovaza 1 gm twice a day.  2. Omeprazole 20 mg once a day.  3. Spironolactone 25 mg once a day.  4. Arimidex 1 mg once a day.  5. Bactrim  800/160 twice a day.  6. Alprazolam 0.5 mg twice a day as needed.  7. Crestor 5 mg once a day.  8. Celebrex 200 mg once a day.  9. Allegra 30 mg twice a day.  10.Aspirin 81 mg once a day.  11.Lexapro 20 mg once a day.  12.Plavix 75 mg once a day.  13.Ramipril 5 mg once a day.  14.Coreg 10 mg once a day.  15.Zetia 10 mg once a day.  16.Calcium with vitamin D 1 tab once a day.   ALLERGIES:  PENICILLIN.   SOCIAL HISTORY:  Lives at home, is a housewife.  Smokes half a pack per  day and has been smoking for the past several years.  Denies any  alcohol.   FAMILY HISTORY:  She reports a history of colitis in a brother and  apparently has had a hemicolectomy.   REVIEW OF SYSTEMS:  A 12-system reviewed and negative except for HPI.  PHYSICAL EXAMINATION:  VITAL SIGNS:  Temperature 97.9, pulse 56, blood  pressure 145/82, respirations 20, satting 94% on room air.  GENERAL:  She is alert and oriented x3 in no distress.  HEENT:  Pupils are equal, round, and reactive to light.  Extraocular  movements are intact.  No JVD.  CARDIOVASCULAR:  S1 and S2, regular rate and rhythm.  LUNGS:  Clear to auscultation bilaterally.  ABDOMEN:  Soft.  Mild epigastric tenderness and left lower quadrant  tenderness.  No rebound.  No rigidity.  Bowel sounds are present.  EXTREMITIES:  No clubbing, cyanosis, or edema.  RECTAL:  Per ED, reports heme positive maroon stools.   LABORATORY DATA:  Hemoglobin 16.3, white count 15.6, platelts 260.  Chemistry:  Sodium 138, potassium 4.6, chloride 101, bicarb 26, BUN 20,  creatinine 1.3, glucose 152, bili 1.38, ALT and alk phos normal, total  protein 7.1, albumin 4.5, total calcium 13.4, lipase 21.  Urine  pregnancy test is negative.  UA is amber, cloudy with trace blood and  ketones, positive for uric acid, red cells and hyalin casts.   DIAGNOSTICS:  1. CT abdomen shows panniculitis suspicious for infectious versus      inflammatory etiology.  2. EKG with sinus  bradycardia.  No acute ST/T changes.   ASSESSMENT/PLAN:  1. A 50 year old female with colitis.  I suspect infectious etiology      given the acute onset and history of eating out as well as exposure      to antibiotics and this being the first episode.  We will start IV      ciprofloxacin 500 b.i.d. and metronidazole 500 t.i.d.  In addition,      IV fluids of normal saline at 100 mL an hour for 2 liters and then      maintain the fluids at 75 an hour.  In addition, p.r.n. Zofran,      Tylenol and IV Dilaudid for severe pain as needed p.r.n.  We will      check stool cultures, stool for ova and parasites, C. diff x2 and      check a lactic acid as well.  2. Hypercalcemia.  Likely secondary to dehydration.  We will stop her      calcium and vitamin D.  We will hydrate with normal saline at 100      mL an hour.  We will give her 1 dose of bisphosphonate 3 mg IV x1,      follow serial calcium levels and monitor her on telemetry.  3. History of breast cancer, continue Arimidex.  4. Deep venous thrombosis prophylaxis with Lovenox 30 mg subcutaneous      daily.  5. Coronary disease, stable.  Continue aspirin, Plavix, statin and      Ramipril.  We will continue aspirin and Plavix in lack of bleeding      for now.  6. Sinus bradycardia.  Occasionally the patient is asymptomatic.  We      will monitor on telemetry.  If it becomes symptomatic, may decrease      the dose of Coreg as well.  7. Acute renal failure likely secondary to dehydration.  Hydrate with      normal saline at 100 mL an hour which will reverse and then 75 mL      an hour.   CODE STATUS:  The patient is a full code.      Zannie Cove, MD  Electronically Signed     PJ/MEDQ  D:  05/27/2009  T:  05/27/2009  Job:  [id number]

## 2011-05-04 NOTE — Discharge Summary (Signed)
[name], [name] NO.:  [id number]   MEDICAL RECORD NO.:  [id number]          PATIENT TYPE:  INP   LOCATION:  2037                         FACILITY:  [facility]   PHYSICIAN:  Thereasa Solo. Little, M.D. DATE OF BIRTH:  1961-01-22   DATE OF ADMISSION:  08/23/2007  DATE OF DISCHARGE:  08/29/2007                               DISCHARGE SUMMARY   DISCHARGE DIAGNOSES:  1. 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  2. Ischemic cardiomyopathy with an ejection fraction of 15-20%.  3. Dyslipidemia with history of statin intolerance at higher doses.  4. History of breast cancer treated with lumpectomy, chemotherapy and      radiation therapy in 2004.  At that time she received Cytoxan and      Adriamycin.  5. History of smoking.  6. History of hypertension, now somewhat hypotensive.   HOSPITAL COURSE:  The patient is a 50 year old female with known  coronary disease.  She had cardiac arrest in 2005 and had a Cipher stent  to her LAD.  EF at that time was 35%.  On August 23, 2007 she was at  the [facility] for a check up when she experienced chest pain.  In  the ER at [facility] she had sinus rhythm with a lateral ST elevation.  She was transferred to [facility] for further evaluation of acute anterior MI.  Urgent catheterization done on August 23, 2007 revealed in stent  restenosis of the LAD, normal circ and 40% RCA with an EF of 15-20%.  She was treated with PTCA and a Driver stent.  Troponin peak was 10.  CK  peak was 569 with 61 MB.  Post procedure she was somewhat hypotensive  and her medications were adjusted.  Her heart rate was in the 50s.  She  was transferred out of the unit and ambulated.  She did have some back  pain.  She has had prior back surgery and was somewhat slow to ambulate.  She was watched over the weekend and gradually improved.  We feel she  could be  discharged on August 29, 2007.   DISCHARGE MEDICATIONS:  1. Aldactone 25 mg a day.  2. Altace 2.5 mg q.h.s.  3. Two baby aspirin a day.  4. Plavix 75 mg a day.  5. Coreg 6.25 mg 1/2 tablet twice a day.  6. Zetia 10 mg once a day.  7. Crestor 10 mg 3 times a week.  8. Prilosec 20 mg a day.  9. Lexapro 10 mg a day.  10.Arimidex 1 mg a day.  11.Nitroglycerin sublingual p.r.n.   LABS:  White count 6.3, hemoglobin 11.9, hematocrit 34.3, platelets 199.  Sodium of 37, potassium 4.2, BUN 10, creatinine 1.0.  Liver functions  are normal.  CK has peaked at 569 with 61 MB and a troponin of 10.  BNP  is 185.  Chest x-ray shows tiny right pleural effusion with no infiltrates.  UA  is unremarkable.  INR is 0.9.  EKG shows a sinus rhythm, sinus  bradycardia  with T wave inversion in 2, AVF, V1 through V6.   DISPOSITION:  The patient was discharged is stable condition and will  followup with Dr. Jacinto Halim as an outpatient.      Abelino Derrick, P.A.    ______________________________  Thereasa Solo. Little, M.D.    Lenard Lance  D:  08/29/2007  T:  08/29/2007  Job:  [id number]

## 2011-05-07 NOTE — H&P (Signed)
Tracey [name], Tracey [name]                       ACCOUNT NO.:  [id number]   MEDICAL RECORD NO.:  [id number]                   PATIENT TYPE:  EMS   LOCATION:  MAJO                                 FACILITY:  [facility]   PHYSICIAN:  Quita Skye. Waldon Reining, MD             DATE OF BIRTH:  Nov 28, 1961   DATE OF ADMISSION:  09/01/2004  DATE OF DISCHARGE:                                HISTORY & PHYSICAL   HISTORY OF PRESENT ILLNESS:  Tracey [name] is a 50 year old white woman  who was admitted to [facility] after suffering a ventricular  fibrillation cardiac arrest, successfully defibrillated, while en route to  the hospital because of chest pain.   The patient has no past history of cardiac disease, including no history of  myocardial infarction, coronary artery disease, congestive heart failure, or  arrhythmia.  Prior to today, she had experienced 3 episodes of chest pain in  the last week.  The first 2 episodes earlier in the week were brief and  resolved within 5 minutes.  The third episode prompted her admission to the  emergency department 2 days ago.  She was discharged from the emergency  department.  Today, she experienced a more severe episode of chest pain.  This occurred while she was sitting and doing homework.  The chest pain was  described as a sharp substernal discomfort.  It radiated to the ulnar aspect  of the left forearm.  It was associated with mild dyspnea, mild diaphoresis,  and mild nausea.  There were no exacerbating or ameliorating factors.  It  appeared to be unrelated to position, activity, meals, or respiration.  EMS  was summoned, and she was transported to the emergency department.  While en  route, she suffered a ventricular fibrillation cardiac arrest.  This was  successfully defibrillated with a single shock.  She immediately regained  consciousness, and she was brought to the emergency department, where she  demonstrated no further arrhythmia or  hemodynamic compromise.  The episode  of chest pain that she experienced today was similar in quality, but more  intense and longer lasting that the 3 prior episodes this week.  The patient  is free of chest pain at this time.  The total duration of chest pain today  was approximately 30 minutes.   There is no history of hypertension, hyperlipidemia, or diabetes mellitus.  The patient smokes one pack of cigarettes per day.  There is a family  history of early coronary artery disease; her mother suffers from coronary  artery disease which began in her 67's.   PAST MEDICAL HISTORY:  Notable only for breast cancer.  She underwent a  lumpectomy, and completed a course of radiation therapy and chemotherapy.   ALLERGIES:  The patient is reportedly allergic to PENICILLIN.   CURRENT MEDICATIONS:  1.  Tamoxifen.  2.  Vicodin.  3.  Ambien.  4.  Several other medications.   PAST  SURGICAL HISTORY:  Previous operations, in addition to the lumpectomy,  include back surgery and a cesarean section.   SOCIAL HISTORY:  The patient does not work.  She is a Physicist, medical at  [employer].  She lives with her husband.  She does not drink.   FAMILY HISTORY:  Her father died of alcoholism.  Her mother is alive, but  suffers from coronary artery disease, as described above.  She has 5  siblings who are alive and well.   REVIEW OF SYSTEMS:  No problems related to her head, eyes, ears, nose,  mouth, throat, lungs, gastrointestinal system, genitourinary system, or  extremities.  There is no history of neurologic or psychiatric disorder.  There is no history of fever, chills, or weight loss.   PHYSICAL EXAMINATION:  VITAL SIGNS:  Blood pressure 122/71, pulse 78 and  regular, respirations 20, temperature 98.6.  GENERAL:  The patient was a middle-aged white woman in no discomfort.  She  was alert, oriented, appropriate, and responsive.  HEENT:  Head, eyes, nose, and mouth were normal.  NECK:   Without thyromegaly or adenopathy.  Carotid pulses were palpable  bilaterally and without bruits.  CARDIAC:  A normal S1 and S2.  There was no S3, S4, murmur, rub, or click.  Cardiac rhythm was regular.  No chest wall tenderness was noted.  LUNGS:  Clear.  ABDOMEN:  Soft and nontender.  There was no mass, hepatosplenomegaly, bruit,  distention, rebound, guarding, or rigidity.  Bowel sounds were normal.  BREASTS/PELVIC/RECTAL:  Not performed, as they were not pertinent to the  reason for acute care hospitalization.  EXTREMITIES:  Without edema, deviation, or deformity.  NEUROLOGIC:  Brief screening neurologic survey was unremarkable.   The first electrocardiogram tonight demonstrated virtually no R wave in V2,  and a Q wave in V3, both new compared to the tracing performed in the  emergency department 2 days ago.  In addition, there was a Q wave in aVL,  new since 2 days ago.  Lastly, there was slight ST segment elevation in  leads I and aVL, also new since the tracing 2 days ago.  There was also  slight ST depression in the inferolateral lead.  A second tracing performed  tonight at 2315 was essentially unchanged from the earlier tracing with no  further progression of changes of acute infarction.  The chest radiograph  report was pending at the time of this dictation.  The initial set of  cardiac markers revealed a myoglobin of 37.3, CK-MB less than 1.0, and  troponin less than 0.05.  The second set of cardiac markers revealed a  myoglobin of 50.9, CK-MB less than 1.0, and troponin less than 0.05.  White  count was 5.0, with a hemoglobin of 12.9, and a hematocrit of 36.7.  The  potassium was 3.8, with a BUN of 13, creatinine of 0.9.   IMPRESSION:  1.  Chest pain - rule out myocardial infarction, rule out unstable angina -      with ventricular fibrillation arrest in ambulance, successfully     defibrillated.  The electrocardiogram suggests the occurrence of an      anterolateral  myocardial infarction in the last few days.  2.  Status post breast cancer.   PLAN:  1.  Cardiac step-down unit.  2.  Serial cardiac enzymes.  3.  Aspirin.  4.  Intravenous heparin.  5.  Intravenous nitroglycerin.  6.  Metoprolol.  7.  Intravenous lidocaine.  8.  Fasting lipid profile.  9.  Discontinue smoking.  10. Echocardiogram.  11. Further measures per Dr. Jacinto Halim.                                                Quita Skye. Waldon Reining, MD    MSC/MEDQ  D:  09/01/2004  T:  09/02/2004  Job:  [id number]   cc:   Cristy Hilts. Jacinto Halim, M.D.  [address]  [place]  [place] [address]  Fax: [phone]

## 2011-05-07 NOTE — Op Note (Signed)
Tracey [name], Tracey [name]                       ACCOUNT NO.:  [id number]   MEDICAL RECORD NO.:  [id number]                   PATIENT TYPE:  AMB   LOCATION:  DAY                                  FACILITY:  [facility]   PHYSICIAN:  Charlynne Pander, D.D.S.          DATE OF BIRTH:  Jan 22, 1961   DATE OF PROCEDURE:  09/04/2003  DATE OF DISCHARGE:                                 OPERATIVE REPORT   PREOPERATIVE DIAGNOSES:  1. Left breast cancer.  2. Status post chemotherapy.  3. Active radiation therapy.  4. Chronic peridontitis.  5. Chronic apical peridontitis.  6. History of acute pulpitis.  7. Accretions.   POSTOPERATIVE DIAGNOSES:  1. Left breast cancer.  2. Status post chemotherapy.  3. Active radiation therapy.  4. Chronic peridontitis.  5. Chronic apical peridontitis.  6. History of acute pulpitis.  7. Accretions.   OPERATIONS:  1. Dental examination.  2. Extraction of multiple teeth (#'s 1, 4, 5, 6, 7, 8, 9, 10, 11, 12, 13,     14, 18, 19, 20, 29, 30 and 31).  3. Four quadrants of alveoloplasty.  4. Gross debridement of the remaining dentition.   SURGEON:  Charlynne Pander, D.D.S.   ASSISTANT:  Elliot Dally Event organiser).   ANESTHESIA:  1. General anesthesia via nasal endotracheal tube.  2. Local anesthesia, with a total utilization of 5 carpules each containing     36 mg Xylocaine with 0.018 mg epinephrine; as well as  One carpule containing 9 mg Marcaine and 0.009 mg epinephrine.   MEDICATIONS:  1. Clindamycin 600 mg IV prior to invasive dental procedures.  2. Toradol 30 mg IV at the end of the dental medicine procedure.   SPECIMENS:  There were 18 teeth which were discarded.   DRAINS OR CULTURES:  None.   COMPLICATIONS:  None.   FLUIDS:  1200 mL lactated Ringer's solution.   ESTIMATED BLOOD LOSS:  100 mL.   INDICATIONS:  The patient had a history of breast cancer and had undergone  chemotherapy.  The patient also was actively undergoing radiation  therapy at  this time.  The patient was examined and treatment planned for extraction of  multiple teeth, with alveoloplasty and gross debridement of the remaining  dentition.  This treatment plan is formulated to decrease the risks and  complications associated with dental infection from affecting the patient's  systemic health.   OPERATIVE FINDINGS:  The patient was examined in operating room #6 at [facility].  The teeth were identified for extraction.  The patient was  noted to be affected by chronic apical peridontitis, chronic peridontitis,  presence of accretions, and a history of acute pulpitis symptoms.   DESCRIPTION OF PROCEDURE:  The patient was brought to the main operating  room #6.  The patient is placed in the supine position on the operating room  table.  General anesthesia was induced via nasal endotracheal tube.  The  patient was then prepped and draped in the usual manner for a dental  medicine procedure.  A throat pack was placed at this time.  The oral cavity  was thoroughly examined with the findings as noted above.  The patient was  then ready for the dental medicine procedure as follows:   Local anesthesia was administered sequentially throughout the two hour long  procedure.   The maxillary left and right quadrants were first approached.  Anesthesia  was delivered as previously described.  A 15 blade incision was made from  the distal of #1 to the meso of #11.  A surgical flap was then reflected.  The upper teeth were then subluxated with a series of straight elevators.  Tooth #1 was then removed with a 53-hr forceps.  The buckle roots fractured  off at this time.  Tooth #s 4, 5, 6, 7, 8, 9 and 10 were then removed with a  150 forceps without complications.  At this point and time, alveoloplasty  was then performed; utilizing a rongeurs and bone file.  A surgical  handpiece and bur was utilized to remove the buckle and interceptor bone  around the  remaining root segments of #1.  These root segments were then  elevated out with a series of straight elevators.  This was achieved without  complications.  Further alveoloplasty was then performed with a rongeurs and  bone file.  The surgical site was then irrigated with copious amounts of  sterile saline.  The tissues were approximated and trimmed appropriately.  A  3-0 chromic gut suture material was utilized to close the distal of the  maxillary right tuberosity, through the meso of #8 utilizing a continuous  interrupted suture technique x1.   The mandibular left quadrant was then approached.  A 15 blade incision was  made from the distal of #15 through the meso of #11.  A surgical flap was  then reflected.  Tooth #'s 11, 12, 13 and 14 were then subluxated with a  series of straight elevators.  Buckle and interceptor bone was removed  around tooth #s 11, 13 and 14.  These teeth were then re-subluxated with a  series of straight elevators.  Tooth #s 11, 13 and 14 were then removed with  a 150 forceps without complications.  At this point and time, the incision  was extended to the tuberosity.  Surgical flap was then further reflected.  Alveoloplasty was then performed utilizing rongeurs and bone file.  The  surgical site was then irrigated with copious amounts of sterile saline.  The surgical site was then closed utilizing 3-0 chromic gut suture material,  in a continuous interrupted suture technique; from the maxillary left  tuberosity through the meso of #9.  Prior to this, the tissues were  approximated and trimmed appropriately.   The mandibular teeth were then approached.  Anesthesia was delivered to the  mandibular left and mandibular right quadrants; as indicated.  The  mandibular left quadrant was first approached.  Tooth #s 18, 19 and 20 were  subluxated with a series of straight elevators.  Tooth #s 18 and 19 were removed with a 23 forceps without complications.  Tooth #20  was then removed  with a  151 forceps without complications.  Alveoloplasty was then performed  utilizing a rongeurs and bone file.  The surgical site was then irrigated  with copious amounts of sterile saline.  The surgical site was then closed  from the distal of #18 through  the meso of #20.  Utilizing 3-0 chromic gut  suture in a continuous interrupted suture technique.   The mandibular right quadrant was then approached.  A 15 blade incision was  made from the distal of #31 through the meso of #28.  Surgical flap was then  reflected.  Tooth #s 29, 30 and 31 were subluxated with a series of straight  elevators.  A 23 forceps was utilized to remove tooth #s 30 and 31 without  complications.  A 151 forceps was then utilized to remove the coronal  portion of tooth #29.  Surgical handpiece and bur were then utilized to  remove buckle and interceptor bone around tooth #29.  The root segment was  then re-subluxated with a series of straight elevators and then removed with  a 151 forceps without complications.  Alveoloplasty was then performed  utilizing a rongeurs and bone file.  The tissues were then approximated and  trimmed appropriately.  The surgical site was then irrigated with copious  amounts of sterile saline x2.  The surgical site was then closed from the  distal of #31 through the meso of #29.  A separate interproximal suture was  placed between tooth #s 27 and 28.   At this point and time, the remaining dentition was approached.  A Kavo  sonic scaler was then utilized to remove the accretions around these teeth.  A series of hand curets were then utilized to remove further accretions.  The Kavo sonic scaler was then again utilized to removed further accretions  of the remaining teeth.  At this point and time the entire mouth was  irrigated with copious amounts of sterile saline.  The patient was examined  for complications; seeing none, dental medicine procedure was deemed to  be  complete.  The throat pack was removed at this time.  Then 4 x 4 gauzes were  placed in the mouth, along with an oral airway at the request of the  anesthesia team.  The patient was then handed over to the anesthesia team  for final disposition.  After an appropriate amount of time, the patient was  extubated and taken to the post-anesthesia care unit with stable vital signs  and good oxygenation level.  All counts were correct for the dental medicine  procedure.                                               Charlynne Pander, D.D.S.    RFK/MEDQ  D:  09/04/2003  T:  09/04/2003  Job:  [id number]   cc:   Valentino Hue. Magrinat, M.D.  [address] [facility]  [place]  [place] [address]  Fax: [phone]   Molli Hazard A. Kathrynn Running, M.D.  [address] [facility]  [place]  [place]  [address]  Fax: [phone]   [phone] FAX TO Kristin Bruins

## 2011-05-07 NOTE — Cardiovascular Report (Signed)
NAMESIHAAM, CHROBAK                       ACCOUNT NO.:  [id number]   MEDICAL RECORD NO.:  [id number]                   PATIENT TYPE:  INP   LOCATION:  2927                                 FACILITY:  [facility]   PHYSICIAN:  Cristy Hilts. Jacinto Halim, M.D.                  DATE OF BIRTH:  [birth year]   DATE OF PROCEDURE:  09/02/2004  DATE OF DISCHARGE:                              CARDIAC CATHETERIZATION   ATTENDING PHYSICIAN:  [facility].   INDICATION:  Ms. Denies Fornes is a 50 year old female with no significant  prior cardiac history, with history of breast cancer, status post left  lumpectomy in February of 2004, who had previously undergone radiation  therapy to her chest.  She is currently on tamoxifen until she is declared  as cured for a total of 5 years and she is presently on less than a year of  tamoxifen.  She has a 1-pack-year history of smoking and also a strong  family history of premature coronary artery disease in that her mother did  have a myocardial infarction at 52 years of age.  She had presented to the  emergency room around 6:30 after her brother-in-law had activated the EMS,  after the patient complained of severe crushing chest pain and passed out.  The patient was found to be in cardiac arrest and was in ventricular  fibrillation and was shocked, and was brought to the emergency room.  During  the transfer, she did have transient ST elevations in the anterior leads and  lateral leads.  However, there was resolution of these EKG changes on  arrival to the emergency room, however, the EKG changes of ST elevation in I  and aVL persisted with recurrence of chest pain while in the emergency room.  Given her abnormal EKG and recurrence of chest pain, she was brought on an  emergent basis to the cardiac catheterization lab to evaluate her coronary  anatomy.   HEMODYNAMIC DATA:  The left ventricular pressure was 109/12 with end-  diastolic pressure of 19  mmHg.  The aortic pressure was 117/80 with a mean  of 97 mmHg.  There was no pressure gradient across the aortic valve.   ANGIOGRAPHIC DATA:  Left ventricle:  The left ventricular systolic function  was markedly reduced with ejection fraction estimated around 35%.  There was  mid-to-distal anterior, anterolateral, anteroapical and inferoapical  akinesis.  There was no significant mitral regurgitation.   Right coronary artery:  The right coronary artery is a large-caliber vessel  and a dominant vessels.  It gives origin to a large PDA and a large PLV.  The RCA in the mid-segment has an irregular lumen with 70% stenosis and a  tandem 60% smooth stenosis after the origin of an RV branch.  The ostium of  the PDA has a 60%, at most 90% smooth stenosis and the PLA has a mid 90%  stenosis.  Left main coronary artery:  Left main coronary artery is a large-caliber  vessel.  It is normal.   Circumflex:  The circumflex artery is a large-caliber vessel.  It gives  origin to a large obtuse marginal 1 which is a major vessel in the  circumflex territory and continues as a small circumflex after the origin of  a high obtuse marginal 1.  It has got mild luminal irregularity.   Left anterior descending artery:  The left anterior descending artery is a  moderate-caliber vessel in its proximal segment.  It gives origin to 2 large  septal perforators in its proximal segment.  After the origin of the septal  perforator, the LAD is completely occluded.  Distally, the LAD has diffuse  disease and gives origin to a very large diagonal 2 which is fairly large as  LAD.   INTERVENTIONAL DATA:  1.  Successful thrombectomy of the LAD with the Expo catheter with      establishment of TIMI-3 flow.  The mid LAD had an ulcerated stenosis.  2.  Successful PTCA and stenting of the mid LAD with a 2.5 x 23.0-mm CYPHER      deployed at 12 atmospheric pressure and post-dilated with a Quantum 2.5      x 15.0-mm at 14  atmospheric pressure.  The stenosis overall was reduced      from 100% to 0% with TIMI-0 to TIMI-3 flow at the end of the procedure.      There was a stent jailing and loss of a very small diagonal 1.   RECOMMENDATION:  The patient will be continued on aggressive risk factor  modification with smoking cessation and elective PCI of the RCA can be  considered.  The patient will be begun on aspirin, Plavix, beta blocker and  ACE inhibitors, and Inspra.  The patient will also be started on Lipitor.   TECHNIQUE OF PROCEDURE:   DIAGNOSTIC CARDIAC CATHETERIZATION:  Under the usual sterile precaution  using a 7-French right femoral artery and a 6-French right femoral venous  access, left heart catheterization was performed through the arterial access  site.  A 6-French multi-purpose B2 catheter was advanced over the ascending  aorta with a __________ [place] JR4.  The catheter was gently advanced in the  left ventricle and the left ventricular pressure was monitored.  Hand  projections in the left ventricle were performed, both in LAO and RAO  positions.  The catheter was then flushed with saline and pulled back into  the ascending aorta and pressure gradient across the aortic valve was  monitored.  The right coronary artery was selectively engaged and  angiography was performed.  In a similar fashion, left main coronary artery  was selectively engaged and angiography was repeated.  Then the catheter was  pulled out of the body in the usual fashion.   TECHNIQUE OF INTERVENTION:  The 6-French B2 catheter was exchanged to a 7-  [place] FL4 guide.  The left main coronary artery was selectively engaged in  the usual fashion.  Then a 190-cm x 0.014-inch ATW guidewire was utilized to  cross into the LAD.  There was initially minimal establishment of flow with  the passage of the wire, however, there was again no flow noted on repositioning the wire.  Then an Expo catheter was utilized to perform   thrombectomy into the mid LAD.  After performing thrombectomy, TIMI-3 flow  was established and ulcerated high-grade stenosis was noted in the mid LAD.  Then a 2.5 x 20.0-mm Maverick balloon was utilized and inflation for 10  atmospheric pressure for 60 seconds was performed.  The balloon was  deflated, pulled back into the guiding catheter and angiography was  repeated.  TIMI-3 flow was again established, however, there was significant  stenosis noted in the mid LAD.  This was least 70%.  Then a 2.5 x 23.0-mm  CYPHER stent was advanced at the same site and after confirming the position  of the stent, the stent was deployed at 12 atmospheric pressure for 1 minute  and post-dilated with a 2.5 x 15.0-mm Quantum at 14 atmospheric pressure and  12 atmospheric pressure for 30 seconds each, keeping the balloon within the  stent.  Then the balloon was deflated, pulled back into the guiding  catheter, 200 mcg of intracoronary nitroglycerin were administered and  angiography was repeated.  Excellent results were noted with TIMI-3 flow.  There was stent jailing of a small diagonal 1 which completely occluded  after the stent implantation.   During this procedure, 3 Expo catheter utilizations, 36 mcg of intracoronary  adenosine were administered and after the establishment of TIMI-3 flow with  the Expo catheter, another 36 mcg of intracoronary adenosine were also  administered prior to balloon dilatation.  Two hundred micrograms of  intracoronary nitroglycerin were administered  at various stages.  Overall, excellent results were noted with reduction of  stenosis from 100% to 0% with TIMI-0 to TIMI-3 flow at the end of the  procedure.  The patient tolerated the procedure well.  A TOTAL OF 200 ML OF  CONTRAST WAS UTILIZED FOR BOTH DIAGNOSTIC AND INTERVENTIONAL PROCEDURE.                                               Cristy Hilts. Jacinto Halim, M.D.    Pilar Plate  D:  09/02/2004  T:  09/02/2004  Job:  [id number]    cc:   Attn:  Tomi Bamberger [facility]   Valentino Hue. Magrinat, M.D.  [address] [facility]  [place]  [place] [address]  Fax: [phone]   [facility]

## 2011-05-07 NOTE — Cardiovascular Report (Signed)
Tracey [name], Tracey [name]                       ACCOUNT NO.:  [id number]   MEDICAL RECORD NO.:  [id number]                   PATIENT TYPE:  INP   LOCATION:  2927                                 FACILITY:  [facility]   PHYSICIAN:  Cristy Hilts. Jacinto Halim, M.D.                  DATE OF BIRTH:  05-26-1961   DATE OF PROCEDURE:  DATE OF DISCHARGE:                              CARDIAC CATHETERIZATION   ADDENDUM:  The anticoagulation was maintained with the help of heparin and  intravenous GP2b3a was utilized for anticoagulation, and HCT was maintained  at therapeutic range.                                               Cristy Hilts. Jacinto Halim, M.D.    Pilar Plate  D:  09/02/2004  T:  09/02/2004  Job:  [id number]

## 2011-05-07 NOTE — Discharge Summary (Signed)
Tracey [name], [name] NO.:  [id number]   MEDICAL RECORD NO.:  [id number]          PATIENT TYPE:  INP   LOCATION:  2032                         FACILITY:  [facility]   PHYSICIAN:  Cristy Hilts. Jacinto Halim, M.D.     DATE OF BIRTH:  25-Nov-1961   DATE OF ADMISSION:  09/01/2004  DATE OF DISCHARGE:  09/07/2004                                 DISCHARGE SUMMARY   ADMISSION DIAGNOSES:  1.  Status post ventricular fibrillation cardiac arrest with successful      defibrillation while on route to the hospital.  2.  Positive myocardial infarction.  3.  Ongoing smoker.  4.  Family history of coronary artery disease.  5.  History of breast cancer.  She is status post lumpectomy, and has      completed a course of radiation therapy and chemotherapy.   DISCHARGE DIAGNOSES:  1.  Status post ventricular fibrillation cardiac arrest with successful      defibrillation while on route to the hospital.  2.  Unstable angina.  3.  Ongoing smoker.  4.  Family history of coronary artery disease.  5.  History of breast cancer.  She is status post lumpectomy, and has      completed a course of radiation therapy and chemotherapy.  6.  Status post cardiac catheterization on September 02, 2004, for Dr. Yates Decamp.      1.  He performed successful thrombectomy at the left anterior descending          with the Expo catheter and establishment of TIMI 3 flow.  The mid          left anterior descending had an ulcerated stenosis.      2.  Successful PTCA stenting of the mid left anterior descending with          the Cypher stent.  Reduced from 100% to 0% with TIMI 0 to TIMI 3          flow at the end of the procedure.  There was a stent jelling and          loss of a very small diagonal one vessel.      3.  Residual CAD at the time of catheterization (see dictation for          details).      4.  EF 35% at catheterization.      5.  Status post 2D echocardiogram with EF 35-45%.  There is akinesis of   the mid distal anterior lateral wall and akinesis of the apical          wall.  There is mild MR.      6.  A 10 beat run of NFVT on September 06, 2004.  Continue beta blocker          therapy.   HISTORY OF PRESENT ILLNESS:  Tracey [name] is a 50 year old white female who  was admitted on September 01, 2004, by Dr. Lemmie Evens.  She is a 72-  year-old white woman who was admitted to [facility] after suffering  a ventricular fibrillation cardiac arrest successfully defibrillated while  on route to the hospital.  She had been in route to the hospital secondary  to chest pain at that time.   She had no past history of cardiac disease including no history of MI, CAD,  CHF or arrythmia.  Prior to the day of admission, she had experienced three  episodes of chest pain in the last week.  The first two episodes were  earlier in the week and were brief and resolved within 5 minutes.  The third  episode prompted her admission for presentation to the ER two days prior.  She was discharged from the ER.  On the day of this admission, she had  experienced a more severe episode of chest pain.  This occurred while  sitting and doing homework.  The chest pain was described as a sharp  substernal discomfort.  It radiated to the ulnar aspect of the left forearm.  It was associated with mild dyspnea, mild diaphoresis and mild nausea.  There were no exacerbating or ameliorating factors.  It appeared to be  unrelated to position, activity, meals or respiration.  EMS was summoned,  and she was transported to the emergency room.  While in route, she suffered  a ventricular fibrillation cardiac arrest.  She was successfully  defibrillated with a single shot.  She immediately regained consciousness  and was brought to the ER where she demonstrated no further arrythmia or  hemodynamic compromise.  The episode of chest pain that she experienced  today was similar in quality but more intense and longer  lasting than the  three prior episodes earlier in the week.  At the time of Dr. Rosaura Carpenter  evaluation, she was free of chest pain at this point.  Total duration of her  chest pain earlier today had been 30 minutes.   She had no prior history of hypertension, hyperlipidemia or diabetes.  She  does smoke one pack of cigarettes a day.  She does have a family history  significant for mother who had CAD beginning in her 78's.   PAST MEDICAL HISTORY:  Notable for breast cancer.  She underwent a  lumpectomy and completed a course of radiation therapy and chemotherapy.   PHYSICAL EXAMINATION:  VITAL SIGNS:  Blood pressure 122/71, heart rate 78.  HEART:  Regular rate and rhythm without murmurs, rubs or gallops.  No  significant abnormalities on exam.   STUDIES:  EKG's were reviewed by Dr. Waldon Reining.  The first EKG of the night  showed virtually no R wave in V2 and a Q in V3.  Both new compared to the  tracing performed in the ER two days prior.  In addition, there was a Q wave  in AVL, new since two days ago.  As well, there was a slight ST segment  elevation in lead 1 and AVL, also new since the tracing two days prior.  There was slight ST depression in the inferior and lateral waves.   A second EKG performed on the night of this admission at 2315 was  essentially unchanged from the earlier tracing with no further progression  changes or acute infarction.   At that point, the cardiac markers were negative.  Electrolytes were normal.   At that point, she was seen by Dr. Lemmie Evens.  He planned for  admission to telemetry and checked serial enzymes, rule out MI.  He reviewed  that she had had ventricular fibrillation arrest in the ambulance __________  defibrillator.  EKG, at this point, suggested recurrence of an anterolateral  MI in the last few days.  He placed her on IV Lidocaine, IV heparin, IV  nitroglycerin, aspirin, beta blocker.  Ordered an echocardiogram for, smoking cessation  evaluation, lipid profile.  He will plan for further  measures per Dr. Verl Dicker service in the morning.   HOSPITAL COURSE:  On September 02, 2004, Tracey [name] underwent cardiac  catheterization by Dr. Yates Decamp.  Please see his dictated report for  details.  He performed:   1.  Successful thrombectomy of the LAD with the Expo catheter and      establishment of TIMI 3 flow.  The mid LAD had an ulcerated stenosis.  2.  Successful PTCA and stenting of the mid LAD with the Cypher stent.  The      stenosis overall was reduced from 100% to 0% with TIMI 0 to TIMI 3 flow      at the end of the procedure.  There was a stent jelling and loss of very      small diagonal 1.   See his dictated report for other details.  She tolerated the procedure well  without complications.  He did use heparin and 2V3A utilization procedure.  Postprocedure, he planned for risk factor modification with smoking  cessation and elective PCI to the RCA would be considered.  She was treated  with aspirin, Plavix, beta blocker ace inhibitors and Inspra and statin  therapy.  She tolerated the procedure well without complications.  The cath  was done emergently and showed EF 35%.   Later in the morning of September 02, 2004, at the time of rounds, she was  having some mild chest pain, but it was not as significant as it had been.  There was no pain in the left arm.  No problems with her groin site.  Telemetry showed sinus rhythm.  Blood pressures was 104/68, heart rate 70.  At this point, she was still on Reopro with some mild persistent chest pain.  Groin site is stable with no hematoma.  Her Reopro would be complete at 1  p.m.  We would wean off he Lidocaine drip during that day, as well.   That afternoon, she underwent 2D echocardiogram which showed EF 35-45%.  There appeared to be akinesis of the mid distal anterior lateral wall and  akinesis of the apical wall.  There was mild to mitral regurgitation.   On  September 02, 2004, she underwent smoking cessation consultation.  She  wanted to with help, and she was recommended the 21 mg patch.   Later on the afternoon of September 02, 2004, she remained stable.  At that  point, she was hemodynamically stable, blood pressure 106/68, heart rate 72.   On September 03, 2004, she reported having one episode of chest pain during  the night and was given morphine with relief.  Otherwise, at this point, she  was remaining stable.  Also, it was noted at 9 p.m. the previous night her  blood pressure dropped into the 70's and she had been given approximately  2650 cc of fluid total during the night.  Her blood pressure was now in the  90's.  She had received all of her medications as ordered on the previous  day.  At this point, her right groin had some ecchymosis, and it was noted  that her hemoglobin had dropped.  Duplex of the groin site was ordered.   Doppler was  done on the day of September 03, 2004, and there was no evidence of pseudoaneurysm.  On September 04, 2004, it was noted that she had not  gotten Coreg the previous day because of relative low blood pressures.  At  that point, Dr. Alanda Amass stopped the Isordil and reinstituted the Coreg.  At that point, her blood pressure was then about 95 systolic.  Her right  groin ecchymosis was stable.   On September 05, 2004, blood pressure was 92/48.  She was maintaining sinus  rhythm.  Hemoglobin was at 10, hematocrit 28.6, other labs normal.  Right  groin ecchymosis was stable.  At that point, she was on Coreg 12.5, Altace  2.5 with blood pressure in the low 90's.   On September 06, 2004, blood pressure remained at about 91/55 to 102/70.  Heart rate was in the 50's in sinus rhythm.  However, she did have a 10 beat  run of wide complex tachycardia that morning.  Dr. Allyson Sabal reviewed this and  felt that it was 10 beats of NSVT.  However, labs had been stable with  normal electrolytes.  She was on low  dose beta blocker and could not be  increased secondary to low blood pressures.  We plan to continue current  beta blocker therapy and monitor.  It was felt that she did not need  antiarrhythmics at this point.   On September 07, 2004, Ms. Luepke is feeling well.  She is having no  dizziness, no lightheadedness, no syncope.  She has been ambulating with no  symptoms.  She is having no chest pain or shortness of breath.  She is  afebrile at 97, pulse 57, blood pressure 191/63.  Heart is irregular rhythm  with normal S1, S2.  Lungs are clear.  No edema.  Her groin site is stable.  Labs are stable.  At this point, she was seen and evaluated by Dr. Yates Decamp  who deemed her stable for discharge home.  He plans to increase the Inspra  dose to 25 mg daily and deemed her stable for discharge home.   Note:  The case management did see the patient and upon their final  evaluation on the day of discharge noted that she was to complete the  paperwork regarding her previous Medicaid and disability, and otherwise,  these things were already in place once she completes her paperwork.   CONSULTATIONS:  None.   PROCEDURES:  1.  Urgent cardiac catheterization on September 13-14, 2005, by Dr. Yates Decamp.  Please see his dictated report for detail.  He performed the      following interventions:      1.  Successful thrombectomy of the LAD with the Expo catheter and          establishment of TIMI 3 flow.  The mid LAD had an ulcerated          stenosis.      2.  Successful PTCA and stenting of the mid LAD with a Cypher stent.          The stenosis overall was reduced from 100% to 0% and went from TIMI          0 to TIMI 3 flow at the end of the procedure.  There was some stent          jelling of a very small diagonal 1.  During the procedure, he used  heparin and IV intravenous 2V3A's.  Postprocedure, he planned to          continue aggressive risk factor modification with smoking  cessation.         We would consider elective PCI of the PCA in the future.  She will          be treated with aspirin, Plavix, beta blocker, ace inhibitor and          Inspra and statin therapy.  She tolerated the procedure well without          complications.  See his dictated report for other details of          findings.  EF of 35%.  2.  A 2D echocardiogram was performed on September 02, 2004.  It showed EF      of 35-45%.  There appeared to be akinesis of the mid distal      anterolateral wall and akinesis of the apical wall.  She had mild mitral      regurgitation on echocardiogram.  3.  For EKG findings, see the HPI section at the beginning of the dictation.      At the time of admission, Dr. Waldon Reining felt that the first EKG upon      admission demonstrated virtually no R wave in V2 and Q wave in V3, both      new compared to the tracing performed in the ER two days prior.  In      addition, there was a Q wave in AVL which was new since two days prior.      As well, there was a slight ST segment elevation in leads 1 and AVL,      also new to this tracing compared to the tracing two days prior.  As      well, there was slight ST depression and inferior lateral lead.  4.  A second tracing performed in the ER on the night of admission was      essentially unchanged from the earlier tracing with no further      progression of changes or acute infarction.  5.  Chest x-ray September 01, 2004, shows chronic blunting of the right      costophrenic angle.  No other significant abnormality.   LABORATORY DATA:  Lipid profile shows total cholesterol 239, triglycerides  199, HDL 44, LDL 155.  TSH normal at 1.084.  On September 04, 2004,  magnesium normal at 1.6.  On September 06, 2004, magnesium normal at 1.8.  At the time of admission, on September 01, 2004, magnesium 1.7, PT 14.6, INR  1.2.  CBC shows white count  10.5, hemoglobin 12.2, hematocrit 35.5,  platelets 179,000.  First cardiac  panel shows CK of 1397, CK MB 239.9,  troponin 30.84.  Second set shows total CK 877, CK MB 130.3, troponin 21.02.  Next set shows CK 507, CK MB 49.1, troponin 14.20.  Her last CBC's performed  on September 07, 2004, had white count of 6.8, hemoglobin 12.9, hematocrit  36.3, platelet 204,000.  Last BMET was on September 06, 2004, and showed  sodium 137, potassium 40, chloride 106, CO2 23, BUN 13, creatinine 0.8.  Note that on admission she did have a CMET that showed normal liver function  tests.   DISCHARGE MEDICATIONS:  1.  Inspra 25 mg one each morning.  2.  Lipitor 80 mg at night.  3.  Aspirin 325 mg daily.  4.  Plavix 75 mg  daily.  5.  Coreg 3.125 b.i.d.  6.  Altace 1.25 mg at night.  7.  Nitroglycerin 0.4 mg sublingual as directed.  8.  Wellbutrin XL 150 mg daily (At the time of the smoking cessation      consult, they discussed nicotine patch.  However, at the time of      discharge, cost was an issue.  The patient would have to pay for the     nicotine patch herself, but her Medicaid would cover the Wellbutrin, so      she opted for Wellbutrin.).  9.  There is some issue as to whether her oncologist is having her continue      Tamoxifen or stop it.  She will need to follow up with them for      instructions about the Tamoxifen.  10. Continue Ambien as before on an as needed basis as directed.  11. She says that she was on some type of pill for hot flashes and another      pill for her bones, but she does not know the name of them.  She      continued them as instructed.   DISCHARGE INSTRUCTIONS:  1.  No strenuous activity, lifting more than 5 pounds, or having sexual      activity for three days.  No strenuous activity until you see Dr. Jacinto Halim.  2.  Low cholesterol diet.  3.  __________  wash groin site.  4.  Call [phone] for any bleeding or pain to the groin site, or if you have      recurrent chest pain or palpitations.  5.  Follow up with Dr. Jacinto Halim October 7 at 2:45  p.m.       MBE/MEDQ  D:  09/07/2004  T:  09/08/2004  Job:  [id number]   cc:   Manson Passey [facility]  Durwin Glaze C. Magrinat, M.D.  [address] [facility]  [place]  [place] [address]  Fax: [phone]

## 2011-05-07 NOTE — Op Note (Signed)
[name], [name]                       ACCOUNT NO.:  [id number]   MEDICAL RECORD NO.:  [id number]                   PATIENT TYPE:  AMB   LOCATION:  DSC                                  FACILITY:  [facility]   PHYSICIAN:  Rose Phi. Maple Hudson, M.D.                DATE OF BIRTH:  Sep 23, 1961   DATE OF PROCEDURE:  01/01/2003  DATE OF DISCHARGE:                                 OPERATIVE REPORT   PREOPERATIVE DIAGNOSIS:  Stage-1 carcinoma of the left breast.   POSTOPERATIVE DIAGNOSIS:  Stage-1 carcinoma of the left breast.   OPERATION:  1. Blue dye injection.  2. Left partial mastectomy.  3. Left sentinel lymph node biopsy.   SURGEON:  Rose Phi. Maple Hudson, M.D.   ANESTHESIA:  General.   OPERATIVE PROCEDURE:  This 50 year old, married female has presented with a  palpable mass in the lower inner quadrant of her left breast.  Core biopsy  has shown invasive mammary cancer.  She is scheduled now for breast  conservation surgery.   Prior to coming to the operating room, 1 mCi of technetium sulfur colloid  was injected intradermally, and we injected 4 cubic centimeters of  Lymphazurin blue after she was put to sleep.  This was injected in the sub-  areolar tissue.  After suitable general anesthesia was induced, the patient  was placed in the supine position with the left arm extended on the arm  board.  After injecting the blue dye, the breast and axilla were prepped and  draped in the usual fashion.   Careful staining with the Neoprobe revealed only a hot spot in the left  axilla.  A short transverse axillary incision was made with dissection  through the subcutaneous tissue to the clavipectoral fascia.  Deep to the  clavipectoral fascia was blue lymphatic going into a blue and hot lymph note  which I removed as the primary sentinel node which had counts of greater  than 1000.  Adjacent to it was a smaller but cold lymph node which I called  the secondary sentinel node and then adjacent to  that was a hot lymph node  with no blue dye which I also called a secondary lymph node.  These were  submitted for touch preps which turned out to be negative.  With good  hemostasis, that incision was closed with 3-0 Vicryl and subcuticular 4-0  Monocryl.   In the meantime, a curved incision over the palpable mass in the lower inner  quadrant of her left breast was then made, and a wide excision of the  palpable tumor was carried out.  This specimen was oriented for the  pathologist.  Hemostasis was obtained with the cautery.   Touch preps of the margins were clean.   Incision was closed with 4-0 Monocryl and then Steri-Strips.  Dressings were  applied.  The patient was transferred to the recovery room having tolerated  the  procedure well.                                               Rose Phi. Maple Hudson, M.D.    PRY/MEDQ  D:  01/01/2003  T:  01/01/2003  Job:  [id number]   cc:   Trilby Drummer --- [place]

## 2011-05-07 NOTE — Cardiovascular Report (Signed)
NAMELANDA, [name]             ACCOUNT NO.:  [id number]   MEDICAL RECORD NO.:  [id number]          PATIENT TYPE:  OIB   LOCATION:  2899                         FACILITY:  [facility]   PHYSICIAN:  Tracey Hilts. Jacinto Halim, MD       DATE OF BIRTH:  [birth year]   DATE OF PROCEDURE:  11/06/2004  DATE OF DISCHARGE:  11/06/2004                              CARDIAC CATHETERIZATION   PROCEDURE PERFORMED:  1.  Left ventriculography.  2.  Selective right and left coronary arteriography.  3.  Abdominal aortogram.  4.  Right femoral angiography and closure of right femoral artery access      with Angio-Seal.   INDICATION:  [name] is a 50 year old female with history of  coronary artery disease status post acute anterior wall myocardial  infarction on September 02, 2004, status post percutaneous transluminal  coronary angioplasty and stenting of the mid  LAD with a 2.5 x 23-mm Cypher.  Her ejection fraction at that time was 35% with mid to distal anterior,  anterior apical and inferoapical akinesis.  She underwent a Cardiolite  stress test in our office which was positive for myocardial ischemia in the  anterior wall.  Given this, she was brought to the cardiac catheterization  lab to re-evaluate her coronary anatomy for any mechanical complications and  to also evaluate subacute stent thrombosis.  She also had borderline RCA  stenosis of 60-70% proximal and mid segments.  However, the Cardiolite was  negative for inferior wall myocardial ischemia.   HEMODYNAMIC DATA:  1.  The left ventricular pressure was 77/2 with end-diastolic pressure of 8      mmHg.  2.  Aortic pressure 79/43 with a mean of 57 mmHg.  3.  There was no pressure gradient across the aortic valve.   These pressures improved immediately with administration of IV fluids.  This  improved to 91/30 with a mean of 62 mmHg.   ANGIOGRAPHIC DATA:  Left ventricle:  Left ventricular systolic function was  normal and ejection fraction  was estimated at 60%.  There is no significant  mitral regurgitation.   Right coronary artery:  The right coronary artery is a large caliber vessel.  It gives origin to a large PLA and PDA.  The proximal segment has 30%  stenosis and the proximal to mid segment has 60-70% and the mid segment at  the crux had 60% stenosis.  These were unchanged from prior cardiac  catheterization.   Left main coronary artery:  Left main coronary artery is a large caliber  vessel. It is normal.   Left anterior descending artery:  Left anterior descending artery is a large  caliber vessel.  The previously placed 2.5 x 23-mm stent is widely patent.  The stent jailed diagonal one is also widely patent.  There was excellent  flow.  There was no evidence of thrombus, no evidence of dissection.  The  LAD continues and wraps around the apex after giving origin to a moderate to  large size diagonal two.  There is mild luminal irregularity of the LAD.   Circumflex:  Circumflex is  a moderate caliber vessel.  It gives high obtuse  marginal 1.  They are normal.   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  They were widely patent.   IMPRESSION:  1.  Normal left ventricular systolic function with ejection fraction of 60%.      This ejection fraction has improved from 35% from previous cardiac      catheterization during acute myocardial infarction on September 02, 2004.  2.  Unchanged borderline stenosis of 60-70% in the right coronary artery.  3.  Left anterior descending stent is widely patent.  4.  Renals are normal.   RECOMMENDATIONS:  Based on the cardiac catheterization, continued medical  therapy is advised.  The Cardiolite was falsely positive suggesting  remodeling passes.  Smoking cessation is again indicated.   TECHNIQUE OF PROCEDURE:  Under usual sterile precautions, using a 6 French  right femoral arterial access, a 6 French B2  pigtail catheter was advanced  into the ascending aorta over a 0.035-inch J wire.  The catheter was then  gently advanced to the left ventricle and left ventricular pressures were  monitored.  Hand contrast injection of the left ventricle was performed both  in the LAO and RAO projection.  The catheter was flushed with saline and  pulled back into the ascending aorta and pressure gradient across the aortic  valve was monitored.  The right coronary artery was selectively engaged and  angiography was performed.  Then, the catheter was pulled back in the  abdominal aorta and abdominal aortogram was performed.  Then, the catheter  was pulled out of the body in the usual fashion.  Then, a 6 French Judkins  left 4 diagnostic catheter was utilized to engage the left main coronary  artery and angiography was repeated.  Then, the catheter was pulled out of  the body in the usual fashion.  Right femoral angiography was performed  through the arterial access sheath, and the access was closed with Angio-  Seal with excellent hemostasis obtained.  The patient tolerated the  procedure well.  No immediate complications noted.      Kirk Ruths  D:  11/06/2004  T:  11/07/2004  Job:  [id number]   cc:   Olena Leatherwood [facility]

## 2011-05-07 NOTE — Op Note (Signed)
   [name], [name]                         ACCOUNT NO.:  [id number]   MEDICAL RECORD NO.:  [id number]                  PATIENT TYPE:  AMB   LOCATION:                                       FACILITY:   PHYSICIAN:  Rose Phi. Maple Hudson, M.D.                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   OPERATION:  Port-A-Cath placement.   SURGEON:  Rose Phi. Maple Hudson, M.D.   ANESTHESIA:  MAC.   DESCRIPTION OF PROCEDURE:  The patient placed on the operating table with a  roll between his shoulders and the right upper chest and neck prepped and  draped in the usual fashion.   Under local anesthesia, the right subclavian puncture was carried out  without much difficulty. The guidewire was then inserted and we had to  manipulate the wire because it kept transversing across to the left side but  we were able to maneuver it so that it went down into the vena cava. After  that, we then made a transverse incision on the anterior chest wall and  developed a pocket for the implantable port. We tunneled between the  subclavian site and the nearly developed pocket and passed the catheter  through that. I then connected the export catheter and trimmed the length  properly and we anchored the port in the pocket with two 2-0 Prolene  sutures. With the catheter tip trimmed to the proper length, we passed the  dilator and peel-away sheath over the wire and then removed the wire  followed by the dilator and passed the catheter through the peel-away sheath  and then removed it. Proper positioning of the catheter tip and no kinking  in the system was confirmed by fluoroscopy.   The incisions were closed with 3-0 Vicryl and subcuticular 4-0 Monocryl and  Steri-Strips. The system was then  flushed and left accessed and fully  heparinized. Dressing applied. The patient transferred to the recovery room  in satisfactory condition having tolerated the procedure well.                                               Rose Phi. Maple Hudson, M.D.    PRY/MEDQ  D:  01/28/2003  T:  01/28/2003  Job:  [id number]   cc:   Valentino Hue. Magrinat, M.D.  [address] [facility]  [place]  [place] [address]  Fax: [phone]

## 2011-05-07 NOTE — Op Note (Signed)
   NAMEDUSTI, TETRO                       ACCOUNT NO.:  [id number]   MEDICAL RECORD NO.:  [id number]                   PATIENT TYPE:  AMB   LOCATION:  DSC                                  FACILITY:  [facility]   PHYSICIAN:  Rose Phi. Maple Hudson, M.D.                DATE OF BIRTH:  04-01-1961   DATE OF PROCEDURE:  09/16/2003  DATE OF DISCHARGE:                                 OPERATIVE REPORT   PREOPERATIVE DIAGNOSIS:  Stage II carcinoma of the left breast.   POSTOPERATIVE DIAGNOSIS:  Stage II carcinoma of the left breast.   OPERATION PERFORMED:  Removal of Port-A-Cath.   SURGEON:  Rose Phi. Maple Hudson, M.D.   ANESTHESIA:  Local.   DESCRIPTION OF PROCEDURE:  The patient was placed on the operating table and  the Port-A-Cath site prepped and draped in the usual fashion.  The area was  infiltrated with 1% Xylocaine with Adrenalin.  Incision was made and the  Port-A-Cath exposed.  I grasped the catheter and removed it and there was no  bleeding.  The two sutures holding it in place were then divided and the  port and catheter removed.  Again there was no bleeding.  The incision was  closed with a subcuticular 4-0 Monocryl with Steri-Strips.  Dressing  applied.  The patient was then transferred to the recovery room and allowed  to go home.                                                Rose Phi. Maple Hudson, M.D.    PRY/MEDQ  D:  09/16/2003  T:  09/16/2003  Job:  [id number]

## 2011-09-20 ENCOUNTER — Ambulatory Visit
Admission: RE | Admit: 2011-09-20 | Discharge: 2011-09-20 | Disposition: A | Discharge status: Home / Self Care | Payer: Medicare Other | Source: Ambulatory Visit | Attending: Cardiology | Admitting: Cardiology

## 2011-09-20 ENCOUNTER — Other Ambulatory Visit: Payer: Self-pay | Admitting: Cardiology

## 2011-09-20 DIAGNOSIS — R0602 Shortness of breath: Secondary | ICD-10-CM

## 2011-09-20 LAB — URINE MICROSCOPIC-ADD ON

## 2011-09-20 LAB — BASIC METABOLIC PANEL
Calcium: 9.5
GFR calc Af Amer: >60
GFR calc non Af Amer: >60
Sodium: 139

## 2011-09-20 LAB — URINALYSIS, ROUTINE W REFLEX MICROSCOPIC
Glucose, UA: NEGATIVE
Leukocytes, UA: NEGATIVE
Protein, ur: NEGATIVE
Urobilinogen, UA: 1

## 2011-09-20 LAB — LIPID PANEL
Cholesterol: 144
LDL Cholesterol: 81
VLDL: 21

## 2011-09-20 LAB — COMPREHENSIVE METABOLIC PANEL
ALT: 16
AST: 14
Albumin: 4.1
Chloride: 107
Creatinine, Ser: 0.74
GFR calc Af Amer: >60
Potassium: 4
Sodium: 139
Total Bilirubin: 0.8

## 2011-09-20 LAB — POCT CARDIAC MARKERS
CKMB, poc: 1.4
Myoglobin, poc: 42.4

## 2011-09-20 LAB — CARDIAC PANEL(CRET KIN+CKTOT+MB+TROPI)
CK, MB: 1.1
Relative Index: INVALID
Total CK: 44
Total CK: 46
Total CK: 48

## 2011-09-20 LAB — TRIGLYCERIDES: Triglycerides: 119

## 2011-09-20 LAB — CBC
Hemoglobin: 13.5
MCHC: 34.5
MCV: 97.4
Platelets: 231
RBC: 4.28
RDW: 14.2
WBC: 10.4

## 2011-09-20 LAB — B-NATRIURETIC PEPTIDE (CONVERTED LAB): Pro B Natriuretic peptide (BNP): 33

## 2011-09-20 LAB — MAGNESIUM: Magnesium: 2.2

## 2011-09-20 LAB — DIFFERENTIAL
Basophils Absolute: 0.1
Eosinophils Absolute: 0
Eosinophils Relative: 1
Lymphocytes Relative: 22
Monocytes Absolute: 0.6

## 2011-09-21 LAB — CARDIAC PANEL(CRET KIN+CKTOT+MB+TROPI)
Relative Index: INVALID
Relative Index: INVALID
Total CK: 52
Total CK: 54
Troponin I: 0.01

## 2011-09-23 ENCOUNTER — Ambulatory Visit (HOSPITAL_COMMUNITY)
Admission: RE | Admit: 2011-09-23 | Discharge: 2011-09-24 | Disposition: A | Discharge status: Home / Self Care | Payer: Medicare Other | Source: Ambulatory Visit | Attending: Cardiology | Admitting: Cardiology

## 2011-09-23 DIAGNOSIS — R0789 Other chest pain: Secondary | ICD-10-CM | POA: Insufficient documentation

## 2011-09-23 DIAGNOSIS — I2510 Atherosclerotic heart disease of native coronary artery without angina pectoris: Secondary | ICD-10-CM | POA: Insufficient documentation

## 2011-09-23 DIAGNOSIS — Z01810 Encounter for preprocedural cardiovascular examination: Secondary | ICD-10-CM | POA: Insufficient documentation

## 2011-09-23 DIAGNOSIS — Z23 Encounter for immunization: Secondary | ICD-10-CM | POA: Insufficient documentation

## 2011-09-23 DIAGNOSIS — Z9861 Coronary angioplasty status: Secondary | ICD-10-CM | POA: Insufficient documentation

## 2011-09-23 LAB — CARDIAC PANEL(CRET KIN+CKTOT+MB+TROPI)
Total CK: 98 U/L (ref 7–177)
Troponin I: <0.3 ng/mL (ref <0.30)

## 2011-09-23 LAB — POCT ACTIVATED CLOTTING TIME: Activated Clotting Time: 210 seconds

## 2011-09-24 LAB — BASIC METABOLIC PANEL
CO2: 26 mEq/L (ref 19–32)
Calcium: 8.4 mg/dL (ref 8.4–10.5)
Creatinine, Ser: 0.89 mg/dL (ref 0.50–1.10)
GFR calc non Af Amer: 73 mL/min — ABNORMAL LOW (ref >90)
Glucose, Bld: 103 mg/dL — ABNORMAL HIGH (ref 70–99)

## 2011-09-24 LAB — CBC
MCH: 31.9 pg (ref 26.0–34.0)
MCHC: 33.6 g/dL (ref 30.0–36.0)
MCV: 94.7 fL (ref 78.0–100.0)
Platelets: 152 10*3/uL (ref 150–400)
RBC: 3.61 MIL/uL — ABNORMAL LOW (ref 3.87–5.11)

## 2011-10-01 LAB — POCT CARDIAC MARKERS: Operator id: 4295

## 2011-10-01 LAB — BASIC METABOLIC PANEL
BUN: 10
BUN: 8
BUN: 8
CO2: 28
CO2: 28
CO2: 30
Calcium: 9.2
Calcium: 9.4
Calcium: 9.7
Chloride: 106
Creatinine, Ser: 0.73
Creatinine, Ser: 1
Creatinine, Ser: 1.11
GFR calc Af Amer: >60
GFR calc Af Amer: >60
GFR calc Af Amer: >60
GFR calc non Af Amer: >60
Glucose, Bld: 107 — ABNORMAL HIGH
Sodium: 141

## 2011-10-01 LAB — DIFFERENTIAL
Eosinophils Relative: 4
Lymphs Abs: 5.9 — ABNORMAL HIGH
Monocytes Absolute: 0.6

## 2011-10-01 LAB — CBC
HCT: 29.2 — ABNORMAL LOW
HCT: 33.9 — ABNORMAL LOW
HCT: 42.1
Hemoglobin: 10.2 — ABNORMAL LOW
Hemoglobin: 11.8 — ABNORMAL LOW
Hemoglobin: 14.6
MCHC: 34.6
MCHC: 34.7
MCHC: 35
MCV: 95.6
MCV: 95.7
MCV: 96.6
Platelets: 156
Platelets: 221
Platelets: 291
RBC: 3.05 — ABNORMAL LOW
RBC: 3.51 — ABNORMAL LOW
RDW: 13.2
RDW: 13.5
RDW: 13.5
RDW: 13.6
WBC: 11.7 — ABNORMAL HIGH
WBC: 4.9
WBC: 8.8

## 2011-10-01 LAB — CARDIAC PANEL(CRET KIN+CKTOT+MB+TROPI)
CK, MB: 22.6 — ABNORMAL HIGH
CK, MB: 3.2
CK, MB: 42.5 — ABNORMAL HIGH
CK, MB: 61.5 — ABNORMAL HIGH
Relative Index: 10.8 — ABNORMAL HIGH
Relative Index: 3.4 — ABNORMAL HIGH
Relative Index: 6.5 — ABNORMAL HIGH
Relative Index: 8.9 — ABNORMAL HIGH
Total CK: 150
Total CK: 349 — ABNORMAL HIGH
Total CK: 480 — ABNORMAL HIGH
Total CK: 569 — ABNORMAL HIGH
Troponin I: 0.29 — ABNORMAL HIGH
Troponin I: 10.01
Troponin I: 2.04
Troponin I: 4.2
Troponin I: 6.04

## 2011-10-01 LAB — COMPREHENSIVE METABOLIC PANEL
ALT: 21
AST: 28
Albumin: 3.1 — ABNORMAL LOW
Alkaline Phosphatase: 37 — ABNORMAL LOW
BUN: 7
CO2: 22
Calcium: 9.9
Chloride: 106
Chloride: 107
GFR calc Af Amer: >60
GFR calc non Af Amer: >60
Glucose, Bld: 83
Potassium: 3.1 — ABNORMAL LOW
Sodium: 138
Total Bilirubin: 0.8

## 2011-10-01 LAB — URINALYSIS, ROUTINE W REFLEX MICROSCOPIC
Bilirubin Urine: NEGATIVE
Leukocytes, UA: NEGATIVE
Nitrite: NEGATIVE
Specific Gravity, Urine: 1.008
pH: 6

## 2011-10-01 LAB — BASIC METABOLIC PANEL WITH GFR
Calcium: 8.7
GFR calc Af Amer: >60
GFR calc non Af Amer: >60
Glucose, Bld: 111 — ABNORMAL HIGH
Potassium: 3.6
Sodium: 140

## 2011-10-01 LAB — URINE MICROSCOPIC-ADD ON

## 2011-10-01 LAB — TROPONIN I: Troponin I: 0.03

## 2011-10-01 LAB — B-NATRIURETIC PEPTIDE (CONVERTED LAB)
Pro B Natriuretic peptide (BNP): 185 — ABNORMAL HIGH
Pro B Natriuretic peptide (BNP): 348 — ABNORMAL HIGH

## 2011-10-01 LAB — PLATELET COUNT: Platelets: 212

## 2011-10-13 NOTE — Cardiovascular Report (Signed)
NAMEROXAN, YAMAMOTO NO.:  [id number]  MEDICAL RECORD NO.:  [id number]  LOCATION:  2506                         FACILITY:  [facility]  PHYSICIAN:  Thereasa Solo. Rebel Willcutt, M.D. DATE OF BIRTH:  12-28-1960  DATE OF PROCEDURE: DATE OF DISCHARGE:                           CARDIAC CATHETERIZATION   INDICATIONS FOR TEST:  This 50 year old female has a history of known coronary artery disease, in 2005 she had a stent placed to her LAD and presented in 2008 with recurrent angina and then had overlapping stents to her LAD.  In March 2011, three years after her last stent, her Plavix was interrupted for an epidural injection and she developed acute stent thrombosis 3 years out.  This was treated with an EXPort catheter and she had no reoccurrence of any problems until in early September when she began having some chest tightness that was anxiety/stress-induced.  She underwent a nuclear study because of this that showed new changes at the apex of her heart worrisome for ischemia and because of this she was brought in for outpatient cardiac catheterization.  After obtaining informed consent, the patient was prepped and draped in the usual sterile fashion exposing the right groin, following local anesthetic of 1% Xylocaine, the Seldinger technique was employed and a 5- [place] introducer sheath was placed in the right femoral artery.  Left and right coronary arteriography and ventriculography was performed.  Attempts at fractional flow wire evaluation of her LAD was unsuccessful. Following this, PCI to her proximal LAD was performed.  TOTAL CONTRAST USED:  190 mL.  DIAGNOSTIC EQUIPMENT:  5-French Judkins configuration catheters.  RESULTS: 1. Hemodynamic monitoring, her central aortic pressure was 113/61.     Her left ventricular pressure was 112/10 and there was no aortic     valve gradient at the time of pullback. 2. Ventriculography.  Ventriculography in the RAO projection  revealed     a 50% ejection fraction with the apex being slightly hypokinetic     and the end-diastolic pressure being 16. 3. Coronary arteriography: 4. Left main was normal and bifurcated. 5. LAD.  There were overlapping stents in the mid LAD.  Distal to the     stents was a second diagonal and the ongoing LAD.  These vessels     were free of disease, but relatively small in diameter.  There was     minor irregularities within the body of the stents, but at the     proximal edge of the stent and proximal to the stent was an area     that was difficult to see, appeared to be about 70% at and its     worst view and was slightly hazy.  Within the stent itself was a     Deshanta Lady aneurysmal area that came off for a very small first     diagonal came off.  There was no reduction of flow in the distal     LAD that I could tell. 6. Circumflex.  The ongoing circumflex was small, it gave rise to one     first OM vessel that was large. 7. Right coronary artery.  There was faint calcification in the right  coronary artery to the proximal half in the very proximal portion,     but well distal from the ostium, were two areas of 40-50%     narrowing.  These are new from her last cath in 2011.  The distal     right, the PDA and the posterior lateral vessels were all free of     disease.  Because of concern of the proximal edge of the stents in her LAD, the decision was made to do a fractional flow reserve evaluation.  The system was upgraded from 5-French to 6-French and a JR-4 guide catheter was used.  The patient was given a total of 5000 units of IV heparin and had an ACT well in excess of 210 before the procedure was started.  It was difficult to get the wire to pass down the LAD, I had taken the wire out and put a new bend down the tip and was in the process of placing the wire back into the system when she complained of chest pain became bradycardic.  At that time, her blood pressure had been  in the 130 range.  The catheter had previously appeared to be slightly ventricularized at least the wave form from the catheter was and I pulled the catheter well back into the central aorta.  We had her cough, her bradycardia resolved without pharmacological treatment and there was no change in the appearance of her anatomy both at the LAD and I relooked at her right coronary artery which also showed no significant changes.  Because of the bradycardic event, I aborted the fractional flow wire reserve and proceeded straight to percutaneous intervention to the proximal LAD.  The same JL-4 guide catheter was used and a short Luge wire was used.  I was able to pass the Luge wire through the LAD stents well into the distal vessel.  The proximal portion of the stent and proximal to the stents were dilated with a 2.5 x 12 Emerge compliant balloon at 12 atmospheres for 35 seconds, then 10 atmospheres for 30 seconds.  A Promus drug-eluting 2.5 x 16 stent was then made ready and placed in such a manner that the proximal segment was well covered and extended down well into the previous stents.  It was deployed at 12 atmospheres for 35 seconds and then 14 atmospheres for 30 seconds.  Postdilatation of this was accomplished with a 2.75 x 12 Loup Quantum balloon.  The overlap was covered at 11 atmospheres for 25 seconds and the proximal portion of the stent was re-expanded with 11 atmospheres for 31 seconds.  With this there was still brisk distal flow down the LAD.  There was no evidence of any dissection or thrombus or distal embolization.  The patient was pain free.  She had been given Angiomax once we decided to proceed on with the intervention.  She is on chronic Plavix 75 mg and I will continue her on this plus aspirin.  I plan to check a troponin and a CK-MB also.  She probably will be able to be discharged to home in the morning.  She has had lots of back problems and has problems  laying on the cath table and at the time of her last cath had a lot of back pain when she laid flat after sheath removal.  I wrote fentanyl for this.  Unfortunately, Dema is statin intolerant.  We talked about fish oil and dietary changes in addition to the Zetia that she has  taken and she is actually taking Crestor 10 mg four times a week and doing reasonably well with this.  In addition to this, she is a heavy smoker.  She has tried several times to stop and I have asked her smoking cessation team to talk to her while she is here.          ______________________________ Thereasa Solo. Kathyann Spaugh, M.D.     ABL/MEDQ  D:  09/23/2011  T:  09/23/2011  Job:  [id number]  cc:   [facility]  Electronically Signed by Julieanne Manson M.D. on 10/13/2011 11:45:10 AM

## 2011-10-13 NOTE — Discharge Summary (Signed)
NAMESUANN, KLIER NO.:  [id number]  MEDICAL RECORD NO.:  [id number]  LOCATION:  2506                         FACILITY:  [facility]  PHYSICIAN:  Thereasa Solo. Almir Botts, M.D. DATE OF BIRTH:  March 29, 1961  DATE OF ADMISSION:  09/23/2011 DATE OF DISCHARGE:  09/24/2011                              DISCHARGE SUMMARY   DISCHARGE DIAGNOSES: 1. Coronary artery disease, status post left heart catheterization     with Promus drug eluting stent to the  proximal left anterior     descending. 2. Tobacco abuse. 3. Ejection fraction of 50% by catheterization this admission. 4. Hyperlipidemia. 5. Chronic back pain.  HOSPITAL COURSE:  Ms. Meany is a 50 year old Caucasian female with history of coronary artery disease, tobacco abuse, hyperlipidemia, chronic back pain.  She had a recent Cardiolite stress test after episode of  chest discomfort.  This showed some mild ischemia. Ejection fraction on the study was 64%.  She had a previously placed LAD stent  in 2005 and to the __________ in 2008.  She had __________ thrombosis in March 2011, 3 years after the last stent.  This occurred when she interrupted her Plavix for an epidural injection.  She was admitted for left heart catheterization which was completed on September 23, 2011.  __________ irregularities in the body of the mid body of the stent, but the proximal end of the stent was difficult to see, appeared to be about 70%, __________Proximal LAD.  The patient is currently doing well, has no complaints.  The patient was seen by __________ discharged home.  Discharge labs, WBC __________, hemoglobin 11.5, hematocrit 34.2, platelets 152, sodium 139, potassium 3.7, chloride 107, CO2 of 26, BUN 16, creatinine 0.89, glucose 103, calcium 8.4.  Cardiac enzymes were checked and.   STUDIES/PROCEDURES:  Catheterization  Sep 23, 2011 showed right ventricular pressure was 112/10, and there waas no gradient on pull-back.   Ventriculography showed ejection fraction of __50%____.  Left main was normal. there was faint calcification of very proximal portion as well as distal from the ostium, were two areas of 40-50% narrowing.  PDA and posterolateral vessels were all free of disease.  PCI was completed, proximal LAD with a Promus drug eluting stent.  DISCHARGE MEDICATIONS: 1. Nitroglycerin sublingual 0.4 mg one tab by mouth every 5 minutes x3     doses for  chest pain. 2. Ramipril 2.5 mg one tab by mouth daily. 3. Crestor 10 mg one tablet Tuesday, Thursday, Saturday, and Sunday at     6 o'clock. 4. Aspirin enteric coated 81 mg one tab by mouth daily. 5. Coreg 3.125 mg one tab by mouth daily. 6. Plavix 75 mg one tab by mouth daily. 7. Xanax 0.5 mg one tab by mouth twice daily as needed for anxiety. 8. Zetia 10 mg by mouth one tab by mouth daily.  DISPOSITION:  Ms. Lepkowski will be discharged home in stable condition __________ and to follow up with Dr. Clarene Duke in approximately 2 weeks __________.    ______________________________ Wilburt Finlay, PA  ______________________________ Thereasa Solo. Keshonna Valvo, M.D.    Jane Canary  D:  09/24/2011  T:  09/24/2011  Job:  [id number]  cc:   [facility]  Electronically Signed by Wilburt Finlay PA on 09/27/2011 10:31:08 AM Electronically Signed by Julieanne Manson M.D. on 10/13/2011 11:44:58 AM

## 2013-04-16 ENCOUNTER — Other Ambulatory Visit: Payer: Self-pay | Admitting: Family Medicine

## 2013-04-16 NOTE — Telephone Encounter (Signed)
?   OK to Refill  

## 2013-04-17 NOTE — Telephone Encounter (Signed)
Pt must be seen.  refill denied.

## 2013-04-17 NOTE — Telephone Encounter (Signed)
See note on refill request for tramadol. She is due for OV. NTBS prior to refill.

## 2013-04-24 ENCOUNTER — Telehealth: Payer: Self-pay | Admitting: Physician Assistant

## 2013-04-25 MED ORDER — ALPRAZOLAM 0.25 MG PO TABS: 0.5000 mg | ORAL_TABLET | Freq: Two times a day (BID) | ORAL | Status: DC

## 2013-04-25 NOTE — Telephone Encounter (Signed)
Patient was last seen on 08/28/2012 per chart did not see anything in Epic when she was last here is it ok to refill

## 2013-04-25 NOTE — Telephone Encounter (Signed)
Approved. # 60 plus one additional refill 

## 2013-04-25 NOTE — Telephone Encounter (Signed)
Prescription called per protocol

## 2013-07-09 ENCOUNTER — Encounter: Payer: Self-pay | Admitting: Family Medicine

## 2013-07-09 ENCOUNTER — Other Ambulatory Visit: Payer: Self-pay | Admitting: Physician Assistant

## 2013-07-09 NOTE — Telephone Encounter (Signed)
Last office visit 08/2012.  Last refill 04/25/13 # 60 + 1 rf.  Letter sent to make appt. Need approval for controlled medication.

## 2013-07-12 ENCOUNTER — Telehealth: Payer: Self-pay | Admitting: *Deleted

## 2013-07-12 NOTE — Telephone Encounter (Signed)
Last refill 04/25/13  #60 + 1 refill  Has been sent letter to schedule appt but has not done so yet Need approval for controlled medication.

## 2013-07-13 NOTE — Telephone Encounter (Signed)
When was letter sent? If > 1 week ago, then she NTBS prior to Rx

## 2013-07-18 NOTE — Telephone Encounter (Signed)
Pt has been called and sent letter.  No response.  No refill

## 2013-07-20 ENCOUNTER — Telehealth: Payer: Self-pay | Admitting: Family Medicine

## 2013-07-20 MED ORDER — ALPRAZOLAM 0.5 MG PO TABS: 0.5000 mg | ORAL_TABLET | Freq: Two times a day (BID) | ORAL | Status: DC | PRN

## 2013-07-20 NOTE — Telephone Encounter (Signed)
Having trouble reaching patient about alprazolam refill.  Was over due for office visit.  Was not responding to voice mess or letters.  Calls today.  Has made follow up appt for next week.  One month refill called to pharmacy

## 2013-07-25 ENCOUNTER — Ambulatory Visit (INDEPENDENT_AMBULATORY_CARE_PROVIDER_SITE_OTHER): Payer: Medicare Other | Admitting: Physician Assistant

## 2013-07-25 ENCOUNTER — Encounter: Payer: Self-pay | Admitting: Physician Assistant

## 2013-07-25 VITALS — BP 122/84 | HR 64 | Temp 97.8°F | Resp 18 | Ht 62.0 in | Wt 156.0 lb

## 2013-07-25 DIAGNOSIS — I2510 Atherosclerotic heart disease of native coronary artery without angina pectoris: Secondary | ICD-10-CM

## 2013-07-25 DIAGNOSIS — E785 Hyperlipidemia, unspecified: Secondary | ICD-10-CM | POA: Insufficient documentation

## 2013-07-25 DIAGNOSIS — I10 Essential (primary) hypertension: Secondary | ICD-10-CM | POA: Insufficient documentation

## 2013-07-25 DIAGNOSIS — F411 Generalized anxiety disorder: Secondary | ICD-10-CM

## 2013-07-25 DIAGNOSIS — F419 Anxiety disorder, unspecified: Secondary | ICD-10-CM

## 2013-07-25 DIAGNOSIS — F17200 Nicotine dependence, unspecified, uncomplicated: Secondary | ICD-10-CM

## 2013-07-25 NOTE — Progress Notes (Signed)
Patient ID: HANNALEE CASTOR MRN: [id number], DOB: November 30, 1961, 52 y.o. Date of Encounter: @DATE @  Chief Complaint:  Chief Complaint  Patient presents with  . routine check up for med refills    HPI: 52 y.o. year old white female  presents for orutine f/u OV. LOV here was 08/28/12. She says she has been feeling good. Has had no angina symptoms. I reviewed chart. Last note I have from cardiology was 12/13. She says she is due to go there for f/u OV. They had been checking her lipids etc but she requests to have this checked here-says it is more convenient to have labs here. I then noticed that none of the cholesterol meds are onher medication list today. She says they were too expensive. Crestor costed >$100 pr month. Zetia was expensive and also Lovaza.  IS taking all the other meds as directed.  She still does not want SSRI. Xanax has been controlling her anxiety. Does not feel depressed.    Past Medical History  Diagnosis Date  . Coronary artery disease   . Hyperlipidemia   . Hypertension   . Smoker unmotivated to quit   . Anxiety      Home Meds: See attached medication section for current medication list. Any medications entered into computer today will not appear on this note's list. The medications listed below were entered prior to today. Current Outpatient Prescriptions on File Prior to Visit  Medication Sig Dispense Refill  . ALPRAZolam (XANAX) 0.5 MG tablet Take 1 tablet (0.5 mg total) by mouth 2 (two) times daily as needed for sleep.  60 tablet  0   No current facility-administered medications on file prior to visit.    Allergies:  Allergies  Allergen Reactions  . Penicillins Rash    History   Social History  . Marital Status: Legally Separated    Spouse Name: N/A    Number of Children: N/A  . Years of Education: N/A   Occupational History  . Not on file.   Social History Main Topics  . Smoking status: Current Every Day Smoker -- 1.00 packs/day for 40 years     Types: Cigarettes  . Smokeless tobacco: Not on file  . Alcohol Use: Not on file  . Drug Use: Not on file  . Sexually Active: Not on file   Other Topics Concern  . Not on file   Social History Narrative  . No narrative on file    History reviewed. No pertinent family history.   Review of Systems:  See HPI for pertinent ROS. All other ROS negative.    Physical Exam: Blood pressure 122/84, pulse 64, temperature 97.8 F (36.6 C), temperature source Oral, resp. rate 18, height 5\' 2"  (1.575 m), weight 156 lb (70.761 kg)., Body mass index is 28.53 kg/(m^2). General: Tanned, WF. Appears in no acute distress. Neck: Supple. No thyromegaly. No lymphadenopathy. No carotid Bruit. Lungs: Clear bilaterally to auscultation without wheezes, rales, or rhonchi. Breathing is unlabored. Heart: RRR with S1 S2. No murmurs, rubs, or gallops. Abdomen: Soft, non-tender, non-distended with normoactive bowel sounds. No hepatomegaly. No rebound/guarding. No obvious abdominal masses. Musculoskeletal:  Strength and tone normal for age. Extremities/Skin: Warm and dry. No clubbing or cyanosis. No edema. No rashes or suspicious lesions. Neuro: Alert and oriented X 3. Moves all extremities spontaneously. Gait is normal. CNII-XII grossly in tact. Psych:  Responds to questions appropriately with a normal affect.     ASSESSMENT AND PLAN:  52 y.o. year old female  with  1. Coronary artery disease On ASA, Plavix. Check CBC - CBC with Differential; Future  2. Hyperlipidemia OFF ALL CHOLESTEROL MEDS SEC TO COST-SEE HPI. RTC Fasting, then treat accordingly. - COMPLETE METABOLIC PANEL WITH GFR; Future - Lipid panel; Future  3. Hypertension At Goal. Cont current meds. Check BMET - COMPLETE METABOLIC PANEL WITH GFR; Future  4. Smoker unmotivated to quit Still smoking 1ppd. Still does not want to quit. Gave Rx for Chantix in past. Never filled it. Still does not want meds or tips for cessation.  5.  Anxiety Cont Xanax at current dose. Recently got refill so no Rx given tday.   ROV 6 months.    [address] [place], [place], [facility] 07/25/2013 4:27 PM

## 2013-07-26 ENCOUNTER — Other Ambulatory Visit: Payer: Medicare Other

## 2013-07-26 DIAGNOSIS — I2510 Atherosclerotic heart disease of native coronary artery without angina pectoris: Secondary | ICD-10-CM

## 2013-07-26 DIAGNOSIS — I10 Essential (primary) hypertension: Secondary | ICD-10-CM

## 2013-07-26 DIAGNOSIS — E785 Hyperlipidemia, unspecified: Secondary | ICD-10-CM

## 2013-07-26 LAB — LIPID PANEL
HDL: 47 mg/dL (ref >39)
LDL Cholesterol: 251 mg/dL — ABNORMAL HIGH (ref 0–99)
Triglycerides: 129 mg/dL (ref <150)
VLDL: 26 mg/dL (ref 0–40)

## 2013-07-26 LAB — CBC WITH DIFFERENTIAL/PLATELET
Hemoglobin: 14.6 g/dL (ref 12.0–15.0)
Lymphocytes Relative: 33 % (ref 12–46)
Lymphs Abs: 2.8 10*3/uL (ref 0.7–4.0)
Neutro Abs: 5.3 10*3/uL (ref 1.7–7.7)
Neutrophils Relative %: 60 % (ref 43–77)
Platelets: 236 10*3/uL (ref 150–400)
RBC: 4.56 MIL/uL (ref 3.87–5.11)
WBC: 8.7 10*3/uL (ref 4.0–10.5)

## 2013-07-26 LAB — COMPLETE METABOLIC PANEL WITH GFR
ALT: 13 U/L (ref 0–35)
AST: 17 U/L (ref 0–37)
Calcium: 9.9 mg/dL (ref 8.4–10.5)
Chloride: 105 mEq/L (ref 96–112)
Creat: 0.95 mg/dL (ref 0.50–1.10)
Sodium: 141 mEq/L (ref 135–145)
Total Protein: 6.7 g/dL (ref 6.0–8.3)

## 2013-07-27 ENCOUNTER — Telehealth: Payer: Self-pay | Admitting: Family Medicine

## 2013-07-27 ENCOUNTER — Other Ambulatory Visit: Payer: Self-pay | Admitting: Physician Assistant

## 2013-07-27 DIAGNOSIS — E785 Hyperlipidemia, unspecified: Secondary | ICD-10-CM

## 2013-07-27 MED ORDER — ATORVASTATIN CALCIUM 80 MG PO TABS: 80.0000 mg | ORAL_TABLET | Freq: Every day | ORAL | Status: DC

## 2013-07-27 NOTE — Telephone Encounter (Signed)
RX to pharmacy.  lmtcb

## 2013-07-27 NOTE — Telephone Encounter (Signed)
Pt aware of lab results, new RX and f/u labs

## 2013-07-27 NOTE — Telephone Encounter (Signed)
Message copied by Donne Anon on Fri Jul 27, 2013  9:26 AM ------      Message from: Allayne Butcher      Created: Thu Jul 26, 2013  7:09 PM       She has h/o severe cardiovascular disease. At OV she told me she had stopped ALL 3 CHOL meds sec to cost!!!-Was on Crestor, Zetia, Lovaza.       HER LDL NOW IS 251 !!!!! HORRIBLE-SHE IS GOING TO HAVE  MI/CVA --SHE MUST GET ON CHOL MED ASAP      Start Lipitor 80mg  one po QD # 30 / one      FLP/LFT 6 weeks.            Tell her rest of labs are good. ------

## 2013-08-31 ENCOUNTER — Telehealth: Payer: Self-pay | Admitting: Physician Assistant

## 2013-08-31 MED ORDER — ALPRAZOLAM 0.5 MG PO TABS: 0.5000 mg | ORAL_TABLET | Freq: Two times a day (BID) | ORAL | Status: DC | PRN

## 2013-08-31 NOTE — Telephone Encounter (Signed)
Xanax 0.5 mg 1 BID #60

## 2013-08-31 NOTE — Telephone Encounter (Signed)
Last OV 07/25/13  Last refill 07/20/13  Refill appropriate.  One month called in

## 2013-09-03 NOTE — Telephone Encounter (Signed)
Approved. Agree. 

## 2013-10-02 ENCOUNTER — Encounter: Payer: Self-pay | Admitting: Cardiology

## 2013-10-03 ENCOUNTER — Ambulatory Visit: Payer: Medicare Other | Admitting: Cardiology

## 2013-10-03 ENCOUNTER — Ambulatory Visit (INDEPENDENT_AMBULATORY_CARE_PROVIDER_SITE_OTHER): Payer: Medicare Other | Admitting: Cardiology

## 2013-10-03 ENCOUNTER — Encounter: Payer: Self-pay | Admitting: Cardiology

## 2013-10-03 VITALS — BP 138/62 | HR 58 | Ht 62.5 in | Wt 161.0 lb

## 2013-10-03 DIAGNOSIS — T889XXS Complication of surgical and medical care, unspecified, sequela: Secondary | ICD-10-CM

## 2013-10-03 DIAGNOSIS — I10 Essential (primary) hypertension: Secondary | ICD-10-CM

## 2013-10-03 DIAGNOSIS — I214 Non-ST elevation (NSTEMI) myocardial infarction: Secondary | ICD-10-CM

## 2013-10-03 DIAGNOSIS — Z9861 Coronary angioplasty status: Secondary | ICD-10-CM

## 2013-10-03 DIAGNOSIS — T82857S Stenosis of cardiac prosthetic devices, implants and grafts, sequela: Secondary | ICD-10-CM

## 2013-10-03 DIAGNOSIS — E785 Hyperlipidemia, unspecified: Secondary | ICD-10-CM

## 2013-10-03 DIAGNOSIS — I2510 Atherosclerotic heart disease of native coronary artery without angina pectoris: Secondary | ICD-10-CM

## 2013-10-03 DIAGNOSIS — F17200 Nicotine dependence, unspecified, uncomplicated: Secondary | ICD-10-CM

## 2013-10-03 DIAGNOSIS — I2109 ST elevation (STEMI) myocardial infarction involving other coronary artery of anterior wall: Secondary | ICD-10-CM

## 2013-10-03 MED ORDER — ATORVASTATIN CALCIUM 80 MG PO TABS: 80.0000 mg | ORAL_TABLET | Freq: Every day | ORAL | Status: DC

## 2013-10-03 MED ORDER — CLOPIDOGREL BISULFATE 75 MG PO TABS: 75.0000 mg | ORAL_TABLET | Freq: Every day | ORAL | Status: DC

## 2013-10-03 MED ORDER — CARVEDILOL 3.125 MG PO TABS: 3.1250 mg | ORAL_TABLET | Freq: Every day | ORAL | Status: DC

## 2013-10-03 MED ORDER — RAMIPRIL 2.5 MG PO CAPS: 2.5000 mg | ORAL_CAPSULE | Freq: Every day | ORAL | Status: DC

## 2013-10-03 NOTE — Patient Instructions (Signed)
I would like for you to try to take the Carevedilol (Coreg) 2 x daily - ~12 hrs apart.  -- it is more effective.  Lets keep trying to stop smoking -- try the E-cigarettes again.    Keep exercising.    I will be looking for your repeat cholesterol levels.  We need to get them down.  If all remains well, I will see you back in 1 year.  Marykay Lex, MD

## 2013-10-05 ENCOUNTER — Encounter: Payer: Self-pay | Admitting: Cardiology

## 2013-10-05 DIAGNOSIS — I2510 Atherosclerotic heart disease of native coronary artery without angina pectoris: Secondary | ICD-10-CM | POA: Insufficient documentation

## 2013-10-05 NOTE — Assessment & Plan Note (Signed)
Pretty much because of, at times she's had work done on the LAD, I would really be reluctant to stop the Plavix.  She is not on aspirin because of GI intolerance.  Short-term Plavix sensation may be  acceptable in certain circumstances.

## 2013-10-05 NOTE — Progress Notes (Signed)
PATIENT: Tracey [name] MRN: [id number]  DOB: 08-Mar-1961   DOV:10/05/2013 PCP: Frazier Richards, PA-C  Clinic Note: Chief Complaint  Patient presents with  . 9 months visit    no chest pain , no sob no swelling ,have not used Zetia in while due to the cost    HPI: Tracey [name] is a 52 y.o. female with a extensive CAD history below who presents today for essentially annual followup. Cardiac History:  2005 for a non-STEMI.  PCI BMS-LAD.   Unstable angina 2008: ISR during introduction of Plavix; Cypher DES  Anterior STEMI in March of 201: Cutting Balloon PTCA of ISR in LAD .   Cath for abnormal Myoview (anteroapical ischemia) September 23, 2011: Proximal ISR --> PCI with Promus Element DES 2.5 x 16 mm Promus DES overlapping previous stents.  (Postdilated 2.75 mm) .   Echocardiogram October 2009, her EF was 55%, relatively normal.   Interval History: Tracey [name] presents today doing relatively well.  She has been feeling "pretty good.  She did have tolerate her Lipitor 4 times a week.  She just had her PCP check lipids back in August and they were not very good.  We tried Crestor, but is too expensive.  She is doing OK with the Lipitor now.  She's also on red yeast rice.  She is limiting her car it once a day said because she forgets.  She still smokes, but has tried several different techniques including electronic cigarettes.  She's scared use Chantix.  She is willing to try again. Every once in a while she'll note an irregular heartbeat, but not prolonged, and nothing that makes her overly concerned.  The remainder of Cardiovascular ROS: no chest pain or dyspnea on exertion negative for - edema, loss of consciousness, murmur, orthopnea, paroxysmal nocturnal dyspnea, rapid heart rate or shortness of breath: Additional cardiac review of systems: Lightheadedness - no, dizziness - no, syncope/near-syncope - no; TIA/amaurosis fugax - no Melena - no, hematochezia no; hematuria - no;  nosebleeds - no; claudication - no  Past Medical History  Diagnosis Date  . CAD S/P percutaneous coronary angioplasty '05, '08, '11, 10/12    LAD PCI with several ISR epsodes  . Hyperlipidemia   . Hypertension   . Smoker unmotivated to quit   . Anxiety   . Breast cancer 2004    lumpectomy, chemo, radiation   Allergies  Allergen Reactions  . Penicillins Rash   Current Outpatient Prescriptions  Medication Sig Dispense Refill  . ALPRAZolam (XANAX) 0.5 MG tablet Take 1 tablet (0.5 mg total) by mouth 2 (two) times daily as needed for sleep.  60 tablet  0  . atorvastatin (LIPITOR) 80 MG tablet TAKE 1 TABLET BY MOUTH  4 x week      . cyclobenzaprine (FLEXERIL) 10 MG tablet Take 1 tablet by mouth 3 (three) times daily as needed.      Marland Kitchen HYDROcodone-acetaminophen (NORCO) 10-325 MG per tablet Take 1 tablet by mouth 2 (two) times daily as needed.      . Red Yeast Rice Extract (RED YEAST RICE PO) Take 1 tablet by mouth daily.      Marland Kitchen atorvastatin (LIPITOR) 80 MG tablet Take 1 tablet (80 mg total) by mouth daily.  90 tablet  3  . carvedilol (COREG) 3.125 MG tablet Take 1 tablet (3.125 mg total) by mouth daily.  180 tablet  3  . clopidogrel (PLAVIX) 75 MG tablet Take 1 tablet (75 mg total) by mouth daily.  90 tablet  3  . ramipril (ALTACE) 2.5 MG capsule Take 1 capsule (2.5 mg total) by mouth daily.  90 capsule  3   No current facility-administered medications for this visit.   History   Social History Narrative   She is married but separated. They have 1 child. She is trying to work out and do a heart-healthy diet and getting some exercise.    She still smokes one-half pack a day.   ROS: A comprehensive Review of Systems - Negative except Occasional muscle cramps.  (Stopped taking Zetia because of his) recently recovered from URI.  PHYSICAL EXAM BP 138/62  Pulse 58  Ht 5' 2.5" (1.588 m)  Wt 161 lb (73.029 kg)  BMI 28.96 kg/m2 General appearance: alert, cooperative, appears stated age,  no distress and borderline obese Neck: no adenopathy, no carotid bruit and no JVD Lungs: clear to auscultation bilaterally, normal percussion bilaterally and non-labored Heart: RRR, S1, S2 normal, no murmur, click, rub or gallop; nondisplaced PMI  Abdomen: soft, non-tender; bowel sounds normal; no masses,  no organomegaly; Extremities: extremities normal, atraumatic, no cyanosis, and  trace bilateral LE edema  Pulses: 2+ and symmetric Skin: mobility and turgor normal or Chronic leathery skin as found with long-term smoking. Neurologic: Mental status: Alert, oriented, thought content appropriate Cranial nerves: normal (II-XII grossly intact)  [id number] today: Yes Rate: 58 , Rhythm: NSR with SAR; otherwise normal ECG,  Recent Labs: Reviewed on Epic - lipid profile with total cholesterol 324, HDL 47, LDL 221 NTG 129  ASSESSMENT / PLAN: ST elevation myocardial infarction (STEMI) of anterior wall - History of ; PTCA of LAD stent ISR She is had both a non-STEMI in a STEMI as well as unstable angina all related to LAD disease. She has not had another ischemic evaluation since her last stent.  It is relatively asymptomatic.  She has no angina or heart failure symptoms.  It would probably be prudent to check a routine Myoview when I see her back in a year.    CAD S/P percutaneous coronary angioplasty - multiple PCI to LAD Doing relatively well.  Has had multiple interventions LAD.  Plan: Continue with clopidogrel (she is not taking aspirin due to GI upset), I would be reluctant to stop this unless necessary for procedures.  She is on a beta blocker which are to switch to twice a day, as well as an ACE inhibitor.  She is taking a statin albeit only 4 days a week.  She is tolerating it at that dose interval.  Non-STEMI (non-ST elevated myocardial infarction) Her initial presentation of CAD was a non-STEMI.  Hypertension  Well controlled on ACE inhibitor and beta blocker.  I reminded her to  carvedilol is a twice a day drug.  Hopefully she'll be to try to get twice a day.  Hyperlipidemia Her primary physician is following this closely.  Her labs are very poorly controlled.  In the past she had been on Mondays and 38, are not sure what happened of those.  I think it is a problem with affording medications.  I will most be in favor of even considering cholestyramine.  I would not use gemfibrozil because she's had some cramping issues in the past.  Fenofibrate would probably be too expensive unless she has coverage through her healthcare plan.  There are coupons online.  Alex that patient have followup set of labs in the winter, I'll be on lookout for the results.  Smoker unmotivated to quit She seems  to be willing to try smoking cessation.  I did spend about 5 minutes talking to her about it.  She did try back today using the electronic cigarettes.  I think she just gave up too quickly.  I would be reluctant to use Chantix and her.  I think it may not be a bad idea to consider something like Wellbutrin to help her with the urge. I referred her to [facility] [place].  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  She is not on aspirin because of GI intolerance.  Short-term Plavix sensation may be  acceptable in certain circumstances.      Orders Placed This Encounter  Procedures  . EKG 12-Lead  . EKG 12-Lead    This order was created through External Result Entry   Meds ordered this encounter  Medications  . atorvastatin (LIPITOR) 80 MG tablet    Sig: TAKE 1 TABLET BY MOUTH  4 x week  . ramipril (ALTACE) 2.5 MG capsule    Sig: Take 1 capsule (2.5 mg total) by mouth daily.    Dispense:  90 capsule    Refill:  3  . clopidogrel (PLAVIX) 75 MG tablet    Sig: Take 1 tablet  (75 mg total) by mouth daily.    Dispense:  90 tablet    Refill:  3  . carvedilol (COREG) 3.125 MG tablet    Sig: Take 1 tablet (3.125 mg total) by mouth daily.    Dispense:  180 tablet    Refill:  3  . atorvastatin (LIPITOR) 80 MG tablet    Sig: Take 1 tablet (80 mg total) by mouth daily.    Dispense:  90 tablet    Refill:  3    **Patient requests 90 days supply**   Followup: 9-12 months  Geena Weinhold W. Herbie Baltimore, M.D., M.S. [facility] [address] [place], [place]  [address]  [phone] Pager # [phone]

## 2013-10-05 NOTE — Assessment & Plan Note (Signed)
She seems to be willing to try smoking cessation.  I did spend about 5 minutes talking to her about it.  She did try back today using the electronic cigarettes.  I think she just gave up too quickly.  I would be reluctant to use Chantix and her.  I think it may not be a bad idea to consider something like Wellbutrin to help her with the urge. I referred her to [facility] [place].

## 2013-10-05 NOTE — Assessment & Plan Note (Signed)
Doing relatively well.  Has had multiple interventions LAD.  Plan: Continue with clopidogrel (she is not taking aspirin due to GI upset), I would be reluctant to stop this unless necessary for procedures.  She is on a beta blocker which are to switch to twice a day, as well as an ACE inhibitor.  She is taking a statin albeit only 4 days a week.  She is tolerating it at that dose interval.

## 2013-10-05 NOTE — Assessment & Plan Note (Signed)
Well controlled on ACE inhibitor and beta blocker.  I reminded her to carvedilol is a twice a day drug.  Hopefully she'll be to try to get twice a day.

## 2013-10-05 NOTE — Assessment & Plan Note (Signed)
Her primary physician is following this closely.  Her labs are very poorly controlled.  In the past she had been on Mondays and 38, are not sure what happened of those.  I think it is a problem with affording medications.  I will most be in favor of even considering cholestyramine.  I would not use gemfibrozil because she's had some cramping issues in the past.  Fenofibrate would probably be too expensive unless she has coverage through her healthcare plan.  There are coupons online.  Alex that patient have followup set of labs in the winter, I'll be on lookout for the results.

## 2013-10-05 NOTE — Assessment & Plan Note (Addendum)
She is had both a non-STEMI in a STEMI as well as unstable angina all related to LAD disease. She has not had another ischemic evaluation since her last stent.  It is relatively asymptomatic.  She has no angina or heart failure symptoms.  It would probably be prudent to check a routine Myoview when I see her back in a year.

## 2013-10-05 NOTE — Assessment & Plan Note (Signed)
Her initial presentation of CAD was a non-STEMI.

## 2013-10-09 ENCOUNTER — Other Ambulatory Visit: Payer: Self-pay | Admitting: Physician Assistant

## 2013-10-09 NOTE — Telephone Encounter (Signed)
Last refill 08/31/13  #60  Last OV 07/25/13  OK refill? 

## 2013-10-10 ENCOUNTER — Telehealth: Payer: Self-pay | Admitting: Physician Assistant

## 2013-10-10 NOTE — Telephone Encounter (Signed)
See phone mess.  Already called in for one month only

## 2013-10-10 NOTE — Telephone Encounter (Signed)
Patient needs her Xanax refilled.  [facility]  [address]

## 2013-10-10 NOTE — Telephone Encounter (Signed)
Approved for #60+ one additional refill. Tell patient this should last 2 months.

## 2013-10-10 NOTE — Telephone Encounter (Signed)
Approved for #60+0 refill.

## 2013-10-10 NOTE — Telephone Encounter (Signed)
rx called in

## 2013-10-10 NOTE — Telephone Encounter (Signed)
Last refill 08/31/13  #60  Last OV 07/25/13  OK refill?

## 2013-11-23 ENCOUNTER — Encounter: Payer: Self-pay | Admitting: Cardiology

## 2013-11-25 ENCOUNTER — Other Ambulatory Visit: Payer: Self-pay | Admitting: Physician Assistant

## 2013-11-26 NOTE — Telephone Encounter (Signed)
Approved For #60+ one additional refill

## 2013-11-26 NOTE — Telephone Encounter (Signed)
Last RF 10/09/13  #60.  Last OV 07/25/13  OK refill?

## 2013-11-26 NOTE — Telephone Encounter (Signed)
RX called in .

## 2014-12-08 ENCOUNTER — Other Ambulatory Visit: Payer: Self-pay | Admitting: Cardiology

## 2014-12-09 ENCOUNTER — Other Ambulatory Visit: Payer: Self-pay | Admitting: Cardiology

## 2014-12-09 NOTE — Telephone Encounter (Signed)
Rx refill sent to patient pharmacy   

## 2014-12-09 NOTE — Telephone Encounter (Signed)
E-SENT PRESCRIPTION ONLY REFILL FOR 30 DAY SUPPLY. PATIENT NEEDS APPOINTMENT

## 2017-04-14 ENCOUNTER — Telehealth: Payer: Self-pay | Admitting: Oncology

## 2017-04-14 NOTE — Telephone Encounter (Signed)
Faxed pt records to [facility] [phone].

## 2021-07-20 DEATH — deceased
